# Patient Record
Sex: Female | Born: 2000 | Race: White | Hispanic: No | Marital: Single | Smoking: Never smoker
Health system: Southern US, Community
[De-identification: ages and names within clinical notes are randomized; demographics above are authoritative.]

## PROBLEM LIST (undated history)

## (undated) DIAGNOSIS — Q909 Down syndrome, unspecified: Secondary | ICD-10-CM

## (undated) DIAGNOSIS — L709 Acne, unspecified: Secondary | ICD-10-CM

## (undated) DIAGNOSIS — L732 Hidradenitis suppurativa: Secondary | ICD-10-CM

## (undated) DIAGNOSIS — H905 Unspecified sensorineural hearing loss: Secondary | ICD-10-CM

## (undated) DIAGNOSIS — K219 Gastro-esophageal reflux disease without esophagitis: Secondary | ICD-10-CM

## (undated) DIAGNOSIS — T7840XA Allergy, unspecified, initial encounter: Secondary | ICD-10-CM

## (undated) HISTORY — PX: ADENOIDECTOMY: SUR15

## (undated) HISTORY — DX: Allergy, unspecified, initial encounter: T78.40XA

## (undated) HISTORY — PX: TONSILLECTOMY: SUR1361

## (undated) HISTORY — DX: Down syndrome, unspecified: Q90.9

## (undated) HISTORY — DX: Hidradenitis suppurativa: L73.2

## (undated) HISTORY — PX: TYMPANOSTOMY TUBE PLACEMENT: SHX32

## (undated) HISTORY — DX: Acne, unspecified: L70.9

---
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
## 2018-01-15 ENCOUNTER — Telehealth: Payer: Self-pay | Admitting: Pediatrics

## 2018-01-15 NOTE — Telephone Encounter (Signed)
Mom wants tro talk to you about Mallory Hancock and her ankle please

## 2018-01-15 NOTE — Telephone Encounter (Signed)
3 years ago Mallory Hancock took dance. She hit her foot on a chair and broke a bone in the left foot. She wants to start clogging and looked up tutorials online. She watched tutorials, practiced and showed her mom what she had learned. The next day, Mallory Hancock c/o pain in the foot. She has a knot on the back of the ankle. She has been icing the ankle, elevating it, taking Motrin. She only complains of pain with weight bearing. The pain has improved but continues to bother her with weight bearing. Instructed mom to continue with cold pack, elevation, and Motrin. If no improvement by Wednesday morning, mom is to call for an appointment. Mom verbalized understanding and agreement.

## 2018-01-17 ENCOUNTER — Encounter: Payer: Self-pay | Admitting: Pediatrics

## 2018-01-17 ENCOUNTER — Ambulatory Visit (INDEPENDENT_AMBULATORY_CARE_PROVIDER_SITE_OTHER): Payer: Medicaid Other | Admitting: Pediatrics

## 2018-01-17 ENCOUNTER — Other Ambulatory Visit: Payer: Self-pay | Admitting: Pediatrics

## 2018-01-17 ENCOUNTER — Telehealth: Payer: Self-pay | Admitting: Pediatrics

## 2018-01-17 ENCOUNTER — Ambulatory Visit
Admission: RE | Admit: 2018-01-17 | Discharge: 2018-01-17 | Disposition: A | Discharge status: Home / Self Care | Payer: Medicaid Other | Source: Ambulatory Visit | Attending: Pediatrics | Admitting: Pediatrics

## 2018-01-17 VITALS — Wt 150.8 lb

## 2018-01-17 DIAGNOSIS — S99911A Unspecified injury of right ankle, initial encounter: Secondary | ICD-10-CM | POA: Insufficient documentation

## 2018-01-17 DIAGNOSIS — M25572 Pain in left ankle and joints of left foot: Secondary | ICD-10-CM

## 2018-01-17 NOTE — Progress Notes (Signed)
Subjective:    Mallory HesselbachSamantha Hancock is a 17 y.o. female who presents with right ankle pain. Onset of the symptoms was 6 days ago. Inciting event: injured while dancing. Current symptoms include: ability to bear weight, but with some pain and swelling. Aggravating factors: direct pressure, going up and down stairs, walking  and weight bearing. Symptoms have stabilized. Patient has had prior ankle problems. Evaluation to date: none. Treatment to date: avoidance of offending activity and OTC analgesics which are somewhat effective. The following portions of the patient's history were reviewed and updated as appropriate: allergies, current medications, past family history, past medical history, past social history, past surgical history and problem list.    Objective:    Wt 150 lb 12.8 oz (68.4 kg)   BMI 29.45 kg/m  Right ankle:   mild+ effusion noted laterally  Left ankle:   normal   Imaging: X-ray of the right ankle(s): ordered, but results not yet available    Assessment:    Injury of right ankle    Plan:    Natural history and expected course discussed. Questions answered. Rest, ice, compression, elevation (RICE) therapy. Transport plannerducational materials distributed. OTC analgesics as needed. Xray ordered, will call parent with results.   Follow up as needed

## 2018-01-17 NOTE — Patient Instructions (Signed)
[facility] [address]- will call with results   RICE for Routine Care of Injuries Many injuries can be cared for using rest, ice, compression, and elevation (RICE therapy). Using RICE therapy can help to lessen pain and swelling. It can help your body to heal. Rest Reduce your normal activities and avoid using the injured part of your body. You can go back to your normal activities when you feel okay and your doctor says it is okay. Ice Do not put ice on your bare skin.  Put ice in a plastic bag.  Place a towel between your skin and the bag.  Leave the ice on for 20 minutes, 2-3 times a day.  Do this for as long as told by your doctor. Compression Compression means putting pressure on the injured area. This can be done with an elastic bandage. If an elastic bandage has been applied:  Remove and reapply the bandage every 3-4 hours or as told by your doctor.  Make sure the bandage is not wrapped too tight. Wrap the bandage more loosely if part of your body beyond the bandage is blue, swollen, cold, painful, or loses feeling (numb).  See your doctor if the bandage seems to make your problems worse.  Elevation Elevation means keeping the injured area raised. Raise the injured area above your heart or the center of your chest if you can. When should I get help? You should get help if:  You keep having pain and swelling.  Your symptoms get worse.  Get help right away if: You should get help right away if:  You have sudden bad pain at or below the area of your injury.  You have redness or more swelling around your injury.  You have tingling or numbness at or below the injury that does not go away when you take off the bandage.  This information is not intended to replace advice given to you by your health care provider. Make sure you discuss any questions you have with your health care provider. Document Released: 01/11/2008 Document Revised: 06/21/2016  Document Reviewed: 07/02/2014 Elsevier Interactive Patient Education  2017 [employer]

## 2018-01-17 NOTE — Telephone Encounter (Signed)
Discussed ankle xray results with mom.

## 2018-01-22 ENCOUNTER — Telehealth: Payer: Self-pay | Admitting: Pediatrics

## 2018-01-22 MED ORDER — RANITIDINE HCL 15 MG/ML PO SYRP: ORAL_SOLUTION | ORAL | 2 refills | Status: DC

## 2018-01-22 NOTE — Telephone Encounter (Signed)
Developed acid reflux over the weekend, complained of chest pain after eating pizza and chips. She is taking mylanta, tums with minimal improvement. Will send in liquid Zantac. Mom verbalized understanding and agreement.

## 2018-01-22 NOTE — Telephone Encounter (Signed)
Mallory Hancock is really bothered by acid reflux and mom wants to know if there is anything in a liquid form that she could take and would like to talk to you please

## 2018-02-22 ENCOUNTER — Telehealth: Payer: Self-pay | Admitting: Pediatrics

## 2018-02-22 DIAGNOSIS — Q909 Down syndrome, unspecified: Secondary | ICD-10-CM

## 2018-02-22 NOTE — Telephone Encounter (Signed)
Mother would like to talk to you about child seeing a nutritionist

## 2018-02-22 NOTE — Telephone Encounter (Signed)
Mallory Hancock has both Medicaid and [employer] insurance. Mom would like to take Mallory Hancock to a nutritionist for tips to help keep Mallory Hancock's weight under control. Will refer to nutrition for further evaluation.

## 2018-04-06 ENCOUNTER — Other Ambulatory Visit: Payer: Self-pay | Admitting: Otolaryngology

## 2018-04-06 DIAGNOSIS — Q909 Down syndrome, unspecified: Secondary | ICD-10-CM

## 2018-04-06 DIAGNOSIS — H7201 Central perforation of tympanic membrane, right ear: Secondary | ICD-10-CM

## 2018-04-18 ENCOUNTER — Ambulatory Visit
Admission: RE | Admit: 2018-04-18 | Discharge: 2018-04-18 | Disposition: A | Discharge status: Home / Self Care | Payer: Medicaid Other | Source: Ambulatory Visit | Attending: Otolaryngology | Admitting: Otolaryngology

## 2018-04-18 DIAGNOSIS — H7201 Central perforation of tympanic membrane, right ear: Secondary | ICD-10-CM

## 2018-04-18 DIAGNOSIS — Q909 Down syndrome, unspecified: Secondary | ICD-10-CM

## 2018-04-18 MED ORDER — IOPAMIDOL (ISOVUE-300) INJECTION 61%
75.0000 mL | Freq: Once | INTRAVENOUS | Status: AC | PRN
  Administered 2018-04-18: 75 mL via INTRAVENOUS

## 2018-04-20 ENCOUNTER — Telehealth: Payer: Self-pay | Admitting: Pediatrics

## 2018-04-20 NOTE — Telephone Encounter (Signed)
Left message, encouraged call back 

## 2018-04-20 NOTE — Telephone Encounter (Signed)
Mother would like to talk to you about child's appointment with Dr Dorma RussellKraus . She will not be available until 2pm

## 2018-04-30 DIAGNOSIS — Z0279 Encounter for issue of other medical certificate: Secondary | ICD-10-CM

## 2018-05-03 ENCOUNTER — Ambulatory Visit: Payer: Medicaid Other | Admitting: Nutrition

## 2018-05-08 ENCOUNTER — Encounter: Payer: Medicaid Other | Attending: Pediatrics | Admitting: Nutrition

## 2018-05-08 ENCOUNTER — Encounter: Payer: Self-pay | Admitting: Nutrition

## 2018-05-08 ENCOUNTER — Ambulatory Visit: Payer: Medicaid Other | Admitting: Nutrition

## 2018-05-08 VITALS — Ht 62.0 in | Wt 145.0 lb

## 2018-05-08 DIAGNOSIS — Q909 Down syndrome, unspecified: Secondary | ICD-10-CM | POA: Diagnosis present

## 2018-05-08 DIAGNOSIS — Z713 Dietary counseling and surveillance: Secondary | ICD-10-CM | POA: Diagnosis not present

## 2018-05-08 DIAGNOSIS — Z6826 Body mass index (BMI) 26.0-26.9, adult: Secondary | ICD-10-CM

## 2018-05-08 NOTE — Progress Notes (Signed)
  Medical Nutrition Therapy:  Appt start time: 1500 end time:  1600  Assessment:  Primary concerns today: Wants to lose weight.  Here with her mom. Her mom is wanting her  to lose weight. Her mother wants her to lose about 10-15 lbs. Thinks she should weigh about 130 lbs. 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 Out going and very high functioning with Downs Syndrome. Eats 3 meals per day. Snacks evening; yogurt or fruit. Food recall reveals food choices are very healthy and balanced.  Recorded wt and ht on Downs Syndrome Charts and she is between 10-20 % ht for age and about 50% for her weight for age on the Down Syndrome Charts. She isn't overweight per BMI of 26.  No chewing or swallowing issues but doesn't like a lot of textures. No bowel issues.  She is engaged to work on increased physical cardio activity like swimming to help with a few pounds of weight loss   Vitals with BMI 05/08/2018  Height 5\' 2"   Weight 145 lbs  BMI 26.51  Systolic   Diastolic     Preferred Learning Style:   No preference indicated   Learning Readiness:   Ready  Change in progress   MEDICATIONS:    DIETARY INTAKE:  24-hr recall:  B ( AM): Oatmeal, applesauce,  Water and 4 oz milk Snk ( AM):   L ( PM):  Chick nugggets with chik fila sauce, applesauce, milk, water Or  chese cube and peaches,  D ( PM):  Brunswick stew 1 cup,  Applesauce, crackers 3-4,water Snk ( PM):  1/2 c chocolate milk- 1 time    Per month. Beverages: water  Usual physical activity: dance and cheer team.  Estimated energy needs: 1200  calories 135 g carbohydrates 90 g protein 33 g fat  Progress Towards Goal(s):  In progress.   Nutritional Diagnosis:  NB-1.1 Food and nutrition-related knowledge deficit As related to being overly concerned about weight and diet.  As evidenced by BMI is 26 and is WNL with Downs Syndrome..    Intervention:  Nutrition and Diabetes education provided on  My Plate, CHO counting, meal planning, portion sizes, timing of meals, avoiding snacks between meals unless having a low blood sugar, target ranges for A1C and blood sugars, signs/symptoms and treatment of hyper/hypoglycemia, monitoring blood sugars, taking medications as prescribed, benefits of exercising 30 minutes per day and prevention of complications of DM. Marland KitchenGoals 1. Follow MY Plate eating three meals per day and don't skip meals.  2. Eat 4-5 servings of fruits and vegetables per day. Consider trying to add swimming into routine for greater cardio workout. Safe weight would be about 140-142 lbs if weight loss is desired.  Teaching Method Utilized:  Visual Auditory Hands on  Handouts given during visit include:  The Plate Method   Meal Plan Card   Barriers to learning/adherence to lifestyle change: none  Demonstrated degree of understanding via:  Teach Back   Monitoring/Evaluation:  Dietary intake, exercise, meal planning , and body weight prn.

## 2018-05-08 NOTE — Patient Instructions (Signed)
Goals 1. Follow MY Plate eating three meals per day and don't skip meals.  2. Eat 4-5 servings of fruits and vegetables per day. Consider trying to add swimming into routine for greater cardio workout. Safe weight would be about 140-142 lbs if weight loss is desired.

## 2018-06-06 ENCOUNTER — Ambulatory Visit (INDEPENDENT_AMBULATORY_CARE_PROVIDER_SITE_OTHER): Payer: Medicaid Other | Admitting: Pediatrics

## 2018-06-06 ENCOUNTER — Encounter: Payer: Self-pay | Admitting: Pediatrics

## 2018-06-06 VITALS — BP 114/70 | Ht 60.0 in | Wt 142.9 lb

## 2018-06-06 DIAGNOSIS — Z68.41 Body mass index (BMI) pediatric, 85th percentile to less than 95th percentile for age: Secondary | ICD-10-CM | POA: Diagnosis not present

## 2018-06-06 DIAGNOSIS — Z23 Encounter for immunization: Secondary | ICD-10-CM

## 2018-06-06 DIAGNOSIS — Z0001 Encounter for general adult medical examination with abnormal findings: Secondary | ICD-10-CM | POA: Diagnosis not present

## 2018-06-06 DIAGNOSIS — Q909 Down syndrome, unspecified: Secondary | ICD-10-CM

## 2018-06-06 DIAGNOSIS — Z0000 Encounter for general adult medical examination without abnormal findings: Secondary | ICD-10-CM

## 2018-06-06 LAB — TSH: TSH: 2.91 m[IU]/L

## 2018-06-06 LAB — CBC WITH DIFFERENTIAL/PLATELET
BASOS ABS: 69 {cells}/uL (ref 0–200)
Basophils Relative: 1.4 %
Eosinophils Absolute: 29 cells/uL (ref 15–500)
Eosinophils Relative: 0.6 %
HCT: 41.8 % (ref 35.0–45.0)
Hemoglobin: 13.9 g/dL (ref 11.7–15.5)
Lymphs Abs: 1269 cells/uL (ref 850–3900)
MCH: 30.3 pg (ref 27.0–33.0)
MCHC: 33.3 g/dL (ref 32.0–36.0)
MCV: 91.1 fL (ref 80.0–100.0)
MPV: 10.6 fL (ref 7.5–12.5)
Monocytes Relative: 6.8 %
NEUTROS ABS: 3200 {cells}/uL (ref 1500–7800)
Neutrophils Relative %: 65.3 %
PLATELETS: 276 10*3/uL (ref 140–400)
RBC: 4.59 10*6/uL (ref 3.80–5.10)
RDW: 13.5 % (ref 11.0–15.0)
Total Lymphocyte: 25.9 %
WBC mixed population: 333 cells/uL (ref 200–950)
WBC: 4.9 10*3/uL (ref 3.8–10.8)

## 2018-06-06 LAB — T4, FREE: Free T4: 1.1 ng/dL (ref 0.8–1.4)

## 2018-06-06 LAB — LIPID PANEL
Cholesterol: 111 mg/dL (ref <170)
HDL: 49 mg/dL (ref >45)
LDL Cholesterol (Calc): 39 mg/dL (calc) (ref <110)
Non-HDL Cholesterol (Calc): 62 mg/dL (calc) (ref <120)
Total CHOL/HDL Ratio: 2.3 (calc) (ref <5.0)
Triglycerides: 151 mg/dL — ABNORMAL HIGH (ref <90)

## 2018-06-06 NOTE — Progress Notes (Signed)
Subjective:     Mallory Hancock is a 17 y.o. female and is here for a comprehensive physical exam. The patient reports no problems.  Social History   Socioeconomic History  . Marital status: Single    Spouse name: Not on file  . Number of children: Not on file  . Years of education: Not on file  . Highest education level: Not on file  Occupational History  . Not on file  Social Needs  . Financial resource strain: Not on file  . Food insecurity:    Worry: Not on file    Inability: Not on file  . Transportation needs:    Medical: Not on file    Non-medical: Not on file  Tobacco Use  . Smoking status: Never Smoker  . Smokeless tobacco: Never Used  Substance and Sexual Activity  . Alcohol use: No  . Drug use: No  . Sexual activity: Never    Birth control/protection: Abstinence  Lifestyle  . Physical activity:    Days per week: Not on file    Minutes per session: Not on file  . Stress: Not on file  Relationships  . Social connections:    Talks on phone: Not on file    Gets together: Not on file    Attends religious service: Not on file    Active member of club or organization: Not on file    Attends meetings of clubs or organizations: Not on file    Relationship status: Not on file  . Intimate partner violence:    Fear of current or ex partner: Not on file    Emotionally abused: Not on file    Physically abused: Not on file    Forced sexual activity: Not on file  Other Topics Concern  . Not on file  Social History Narrative   Lives with mom and dad, one older half siblings   Home schooled   Supportive home environment, independent self care   Social opportunities thru family, school groups, community   MGF passed away in Oct 14, 2013- family aware of [facility] program thru [facility]osp[facility] if needed.   Health Maintenance  Topic Date Due  . HIV Screening  05/01/2014  . INFLUENZA VACCINE  03/08/2018  . TETANUS/TDAP  03/30/2021    The following portions of the patient's  history were reviewed and updated as appropriate: allergies, current medications, past family history, past medical history, past social history, past surgical history and problem list.  Review of Systems Pertinent items are noted in HPI.   Objective:    BP 114/70   Ht 5' (1.524 m)   Wt 142 lb 14.4 oz (64.8 kg)   BMI 27.91 kg/m  General appearance: alert, cooperative, appears stated age and no distress Head: Normocephalic, without obvious abnormality, atraumatic Eyes: conjunctivae/corneas clear. PERRL, EOM's intact. Fundi benign. Ears: normal TM's and external ear canals both ears Nose: Nares normal. Septum midline. Mucosa normal. No drainage or sinus tenderness. Throat: lips, mucosa, and tongue normal; teeth and gums normal Neck: no adenopathy, no carotid bruit, no JVD, supple, symmetrical, trachea midline and thyroid not enlarged, symmetric, no tenderness/mass/nodules Lungs: clear to auscultation bilaterally Heart: regular rate and rhythm, S1, S2 normal, no murmur, click, rub or gallop Abdomen: soft, non-tender; bowel sounds normal; no masses,  no organomegaly Extremities: extremities normal, atraumatic, no cyanosis or edema Skin: Skin color, texture, turgor normal. No rashes or lesions Neurologic: Grossly normal    Assessment:    Healthy female exam  Plan:  Annual labs per orders See After Visit Summary for Counseling Recommendations   Flu and MenB vaccines per orders. Flu vaccine per orders. Indications, contraindications and side effects of vaccine/vaccines discussed with parent and parent verbally expressed understanding and also agreed with the administration of vaccine/vaccines as ordered above today.Handout (VIS) given for each vaccine at this visit.

## 2018-06-06 NOTE — Patient Instructions (Signed)
Preventive Care for [place], Female The transition to life after high school as a young adult can be a stressful time with many changes. You may start seeing a primary care physician instead of a pediatrician. This is the time when your health care becomes your responsibility. Preventive care refers to lifestyle choices and visits with your health care provider that can promote health and wellness. What does preventive care include?  A yearly physical exam. This is also called an annual wellness visit.  Dental exams once or twice a year.  Routine eye exams. Ask your health care provider how often you should have your eyes checked.  Personal lifestyle choices, including: ? Daily care of your teeth and gums. ? Regular physical activity. ? Eating a healthy diet. ? Avoiding tobacco and drug use. ? Avoiding or limiting alcohol use. ? Practicing safe sex. ? Taking vitamin and mineral supplements as recommended by your health care provider. What happens during an annual wellness visit? Preventive care starts with a yearly visit to your primary care physician. The services and screenings done by your health care provider during your annual wellness visit will depend on your overall health, lifestyle risk factors, and family history of disease. Counseling Your health care provider may ask you questions about:  Past medical problems and your family's medical history.  Medicines or supplements you take.  Health insurance and access to health care.  Alcohol, tobacco, and drug use.  Your safety at home, work, or school.  Access to firearms.  Emotional well-being and how you cope with stress.  Relationship well-being.  Diet, exercise, and sleep habits.  Your sexual health and activity.  Your methods of birth control.  Your menstrual cycle.  Your pregnancy history.  Screening You may have the following tests or measurements:  Height, weight, and BMI.  Blood  pressure.  Lipid and cholesterol levels.  Tuberculosis skin test.  Skin exam.  Vision and hearing tests.  Screening test for hepatitis.  Screening tests for sexually transmitted diseases (STDs), if you are at risk.  BRCA-related cancer screening. This may be done if you have a family history of breast, ovarian, tubal, or peritoneal cancers.  Pelvic exam and Pap test. This may be done every 3 years starting at age 35.  Vaccines Your health care provider may recommend certain vaccines, such as:  Influenza vaccine. This is recommended every year.  Tetanus, diphtheria, and acellular pertussis (Tdap, Td) vaccine. You may need a Td booster every 10 years.  Varicella vaccine. You may need this if you have not been vaccinated.  HPV vaccine. If you are 25 or younger, you may need three doses over 6 months.  Measles, mumps, and rubella (MMR) vaccine. You may need at least one dose of MMR. You may also need a second dose.  Pneumococcal 13-valent conjugate (PCV13) vaccine. You may need this if you have certain conditions and were not previously vaccinated.  Pneumococcal polysaccharide (PPSV23) vaccine. You may need one or two doses if you smoke cigarettes or if you have certain conditions.  Meningococcal vaccine. One dose is recommended if you are age 24-21 years and a first-year college student living in a residence hall, or if you have one of several medical conditions. You may also need additional booster doses.  Hepatitis A vaccine. You may need this if you have certain conditions or if you travel or work in places where you may be exposed to hepatitis A.  Hepatitis B vaccine. You may need this if  you have certain conditions or if you travel or work in places where you may be exposed to hepatitis B.  Haemophilus influenzae type b (Hib) vaccine. You may need this if you have certain risk factors.  Talk to your health care provider about which screenings and vaccines you need and how  often you need them. What steps can I take to develop healthy behaviors?  Have regular preventive health care visits with your primary care physician and dentist.  Eat a healthy diet.  Drink enough fluid to keep your urine clear or pale yellow.  Stay active. Exercise at least 30 minutes 5 or more days of the week.  Use alcohol responsibly.  Maintain a healthy weight.  Do not use any products that contain nicotine, such as cigarettes, chewing tobacco, and e-cigarettes. If you need help quitting, ask your health care provider.  Do not use drugs.  Practice safe sex.  Use birth control (contraception) to prevent unwanted pregnancy. If you plan to become pregnant, see your health care provider for a pre-conception visit.  Find healthy ways to manage stress. How can I protect myself from injury? Injuries from violence or accidents are the leading cause of death among young adults and can often be prevented. Take these steps to help protect yourself:  Always wear your seat belt while driving or riding in a vehicle.  Do not drive if you have been drinking alcohol. Do not ride with someone who has been drinking.  Do not drive when you are tired or distracted. Do not text while driving.  Wear a helmet and other protective equipment during sports activities.  If you have firearms in your house, make sure you follow all gun safety procedures.  Seek help if you have been bullied, physically abused, or sexually abused.  Use the Internet responsibly to avoid dangers such as online bullying and online sexual predators.  What can I do to cope with stress? Young adults may face many new challenges that can be stressful, such as finding a job, going to college, moving away from home, managing money, being in a relationship, getting married, and having children. To manage stress:  Avoid known stressful situations when you can.  Exercise regularly.  Find a stress-reducing activity that  works best for you. Examples include meditation, yoga, listening to music, or reading.  Spend time in nature.  Keep a journal to write about your stress and how you respond.  Talk to your health care provider about stress. He or she may suggest counseling.  Spend time with supportive friends or family.  Do not cope with stress by: ? Drinking alcohol or using drugs. ? Smoking cigarettes. ? Eating.  Where can I get more information? Learn more about preventive care and healthy habits from:  [place] and Gynecologists: KaraokeExchange.nl  U.S. Probation officer Task Force: StageSync.si  National Adolescent and [place]: StrategicRoad.nl  American Academy of Pediatrics Bright Futures: https://brightfutures.MemberVerification.co.za  Society for Adolescent Health and Medicine: MoralBlog.co.za.aspx  PodExchange.nl: ToyLending.fr  This information is not intended to replace advice given to you by your health care provider. Make sure you discuss any questions you have with your health care provider. Document Released: 12/10/2015 Document Revised: 12/31/2015 Document Reviewed: 12/10/2015 Elsevier Interactive Patient Education  [employer].

## 2018-06-08 ENCOUNTER — Other Ambulatory Visit: Payer: Self-pay | Admitting: Pediatrics

## 2018-06-08 ENCOUNTER — Telehealth: Payer: Self-pay | Admitting: Pediatrics

## 2018-06-08 MED ORDER — PROBIOTIC-10 PO CAPS: 1.0000 | ORAL_CAPSULE | Freq: Every day | ORAL | 6 refills | Status: DC

## 2018-06-08 NOTE — Telephone Encounter (Signed)
Left message, encouraged call back,    Annual labs were WNL, except triglycerides which were elevated. Lipid panel was not fasting.

## 2018-06-08 NOTE — Telephone Encounter (Signed)
Spoke with mom re: Vana's lab results WNL. Mom verbalized understanding.

## 2018-06-12 ENCOUNTER — Ambulatory Visit (INDEPENDENT_AMBULATORY_CARE_PROVIDER_SITE_OTHER): Payer: Medicaid Other | Admitting: Pediatrics

## 2018-06-12 ENCOUNTER — Encounter: Payer: Self-pay | Admitting: Pediatrics

## 2018-06-12 VITALS — Temp 98.7°F | Wt 144.5 lb

## 2018-06-12 DIAGNOSIS — J029 Acute pharyngitis, unspecified: Secondary | ICD-10-CM

## 2018-06-12 LAB — POCT RAPID STREP A (OFFICE): Rapid Strep A Screen: NEGATIVE

## 2018-06-12 NOTE — Patient Instructions (Addendum)
Warm salt water gargles or hot tea with honey/hot water with honey and lemon Encourage plenty of fluids Nasal decongestant as needed Rapid strep negative, throat culture sent to lab- no news is good news

## 2018-06-12 NOTE — Progress Notes (Signed)
Subjective:     Mallory Hancock is a 17 y.o. female who presents for evaluation of sore throat. Associated symptoms include nasal blockage, post nasal drip, productive cough, sinus and nasal congestion, sore throat and low grade fevers. Onset of symptoms was 2 days ago, and have been gradually worsening since that time. She is drinking plenty of fluids. She has not had a recent close exposure to someone with proven streptococcal pharyngitis.  The following portions of the patient's history were reviewed and updated as appropriate: allergies, current medications, past family history, past medical history, past social history, past surgical history and problem list.  Review of Systems Pertinent items are noted in HPI.    Objective:    Temp 98.7 F (37.1 C) (Temporal)   Wt 144 lb 8 oz (65.5 kg)   BMI 28.22 kg/m  General appearance: alert, cooperative, appears stated age and no distress Head: Normocephalic, without obvious abnormality, atraumatic Eyes: conjunctivae/corneas clear. PERRL, EOM's intact. Fundi benign. Ears: normal TM's and external ear canals both ears Nose: Nares normal. Septum midline. Mucosa normal. No drainage or sinus tenderness., moderate congestion Throat: lips, mucosa, and tongue normal; teeth and gums normal, pharynx erythematous without exudate Neck: no adenopathy, no carotid bruit, no JVD, supple, symmetrical, trachea midline and thyroid not enlarged, symmetric, no tenderness/mass/nodules Lungs: clear to auscultation bilaterally Heart: regular rate and rhythm, S1, S2 normal, no murmur, click, rub or gallop  Laboratory Strep test done. Results:negative.    Assessment:    Acute pharyngitis, likely  Viral pharyngitis.    Plan:    Use of OTC analgesics recommended as well as salt water gargles. Use of decongestant recommended. Follow up as needed. Throat culture pending, will call parent/patient if culture results positive. Parent/patient aware.

## 2018-06-15 LAB — CULTURE, GROUP A STREP
MICRO NUMBER: 91336423
SPECIMEN QUALITY:: ADEQUATE

## 2018-09-26 ENCOUNTER — Ambulatory Visit: Payer: Medicaid Other

## 2018-10-19 ENCOUNTER — Telehealth: Payer: Self-pay | Admitting: Pediatrics

## 2018-10-19 NOTE — Telephone Encounter (Signed)
Note written for school.

## 2018-10-19 NOTE — Telephone Encounter (Signed)
Mom needs a letter saying that Mallory Hancock's parents pulled her out of school because of the [place] 17 and her respiratory infection and that we are aware of it. If you have any questions mom said to call her please

## 2019-03-19 ENCOUNTER — Other Ambulatory Visit: Payer: Self-pay

## 2019-03-19 ENCOUNTER — Encounter: Payer: Self-pay | Admitting: Pediatrics

## 2019-03-19 ENCOUNTER — Ambulatory Visit (INDEPENDENT_AMBULATORY_CARE_PROVIDER_SITE_OTHER): Payer: Medicaid Other | Admitting: Pediatrics

## 2019-03-19 VITALS — Wt 148.4 lb

## 2019-03-19 DIAGNOSIS — R590 Localized enlarged lymph nodes: Secondary | ICD-10-CM

## 2019-03-19 NOTE — Progress Notes (Signed)
Subjective:     Mallory Hancock is a 18 y.o. female who presents for evaluation of lump below the left ear. Symptoms include none. Onset of symptoms was 2 days ago, and has been stable since that time. Treatment to date: none.  The following portions of the patient's history were reviewed and updated as appropriate: allergies, current medications, past family history, past medical history, past social history, past surgical history and problem list.  Review of Systems Pertinent items are noted in HPI.   Objective:    General appearance: alert, cooperative, appears stated age and no distress Head: Normocephalic, without obvious abnormality, atraumatic Eyes: conjunctivae/corneas clear. PERRL, EOM's intact. Fundi benign. Ears: normal TM's and external ear canals both ears Nose: Nares normal. Septum midline. Mucosa normal. No drainage or sinus tenderness. Throat: lips, mucosa, and tongue normal; teeth and gums normal Neck: no adenopathy, no carotid bruit, no JVD, supple, symmetrical, trachea midline and thyroid not enlarged, symmetric, no tenderness/mass/nodules Lungs: clear to auscultation bilaterally Heart: regular rate and rhythm, S1, S2 normal, no murmur, click, rub or gallop   Assessment:    Anterior cervical lymphadenopathy   Plan:    No palpable lymph node at time of exam Follow up as needed

## 2019-07-12 IMAGING — CT CT TEMPORAL BONES W/ CM
2 series · 15 of 30 positions shown, 19 images · IV contrast (iopamidol)
Comparison: None

CLINICAL DATA: Down syndrome. Left ear perforation. Evaluate
mastoiditis. History tonsillectomy and myringotomy tube.

EXAM:
CT TEMPORAL BONES WITH CONTRAST
TECHNIQUE: Axial and coronal plane CT imaging of the petrous temporal bones was
performed with thin-collimation image reconstruction after
intravenous contrast administration. Multiplanar CT image
reconstructions were also generated.
CONTRAST:  75mL 0CAATN-UHH IOPAMIDOL (0CAATN-UHH) INJECTION 61%

[Series 4: soft temporal · axial · 0.34mm/px · z∈[-136,-72]mm · 13 of 38 slices shown, 17 images]
[im 3/38  brain]
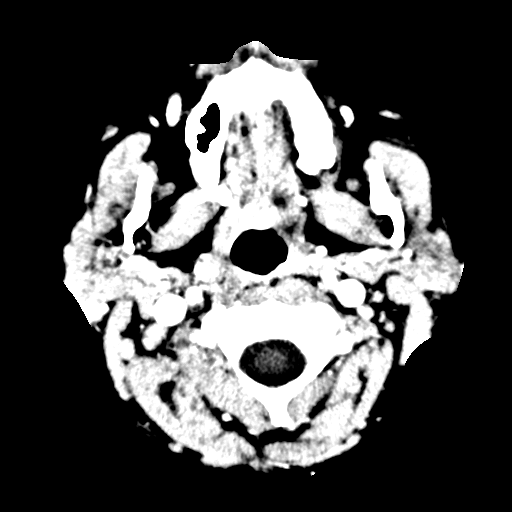
[im 3/38  bone]
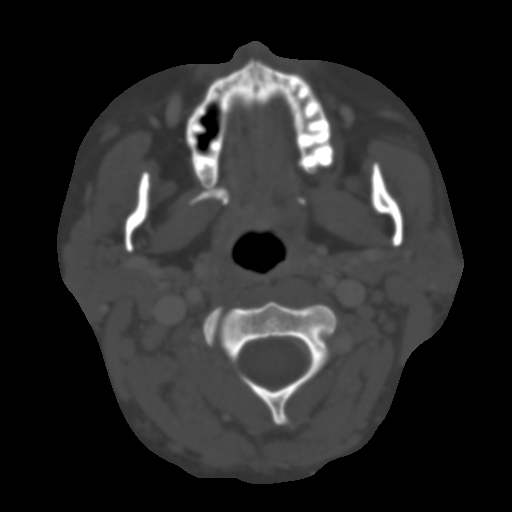
[im 6/38  bone]
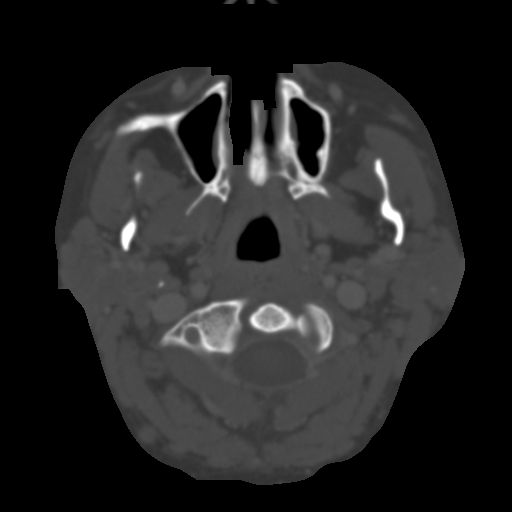
[im 8/38  bone]
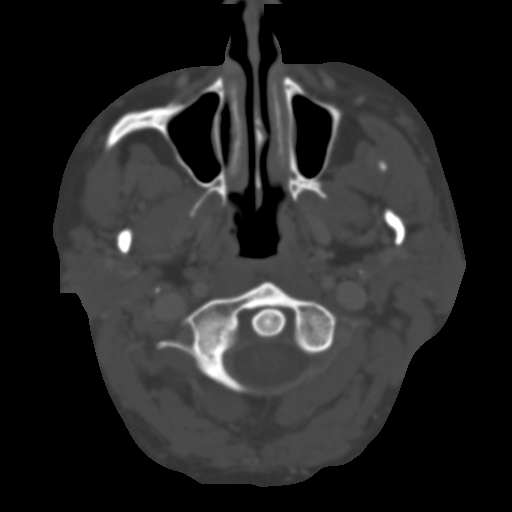
[im 11/38  bone]
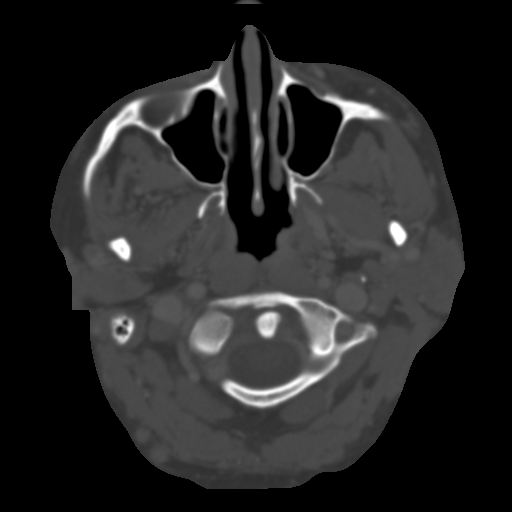
[im 14/38  brain]
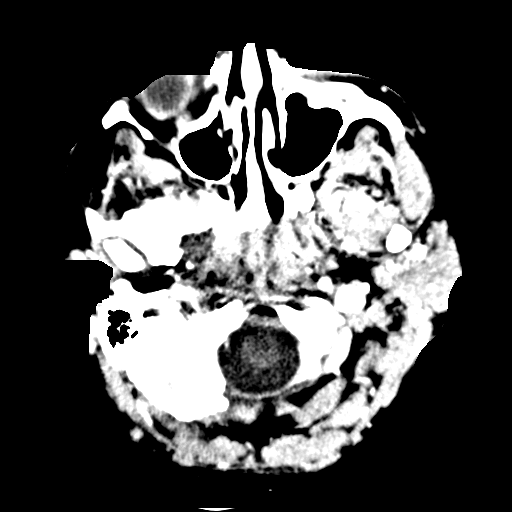
[im 14/38  bone]
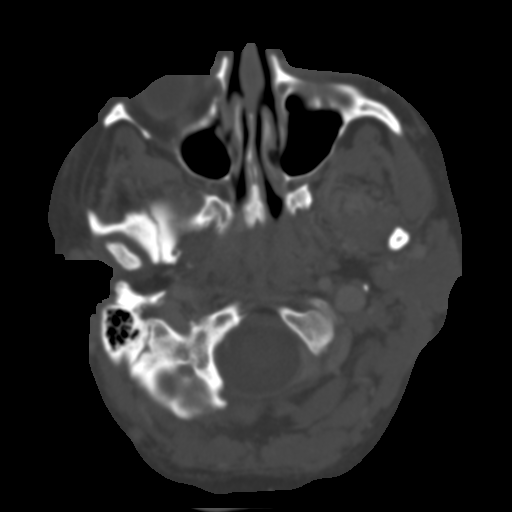
[im 16/38  bone]
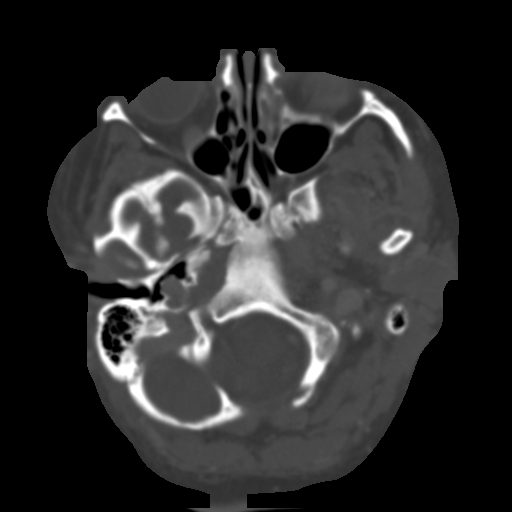
[im 19/38  bone]
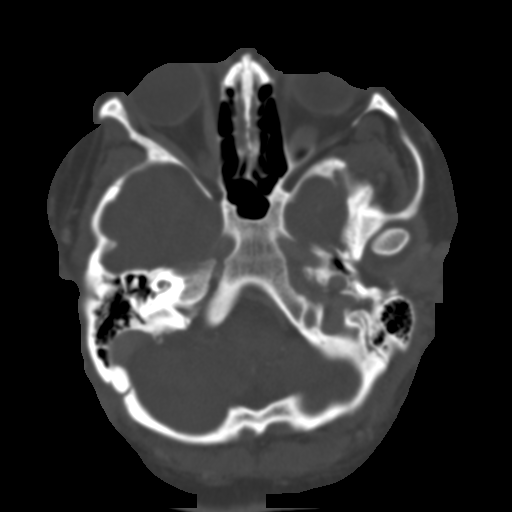
[im 22/38  bone]
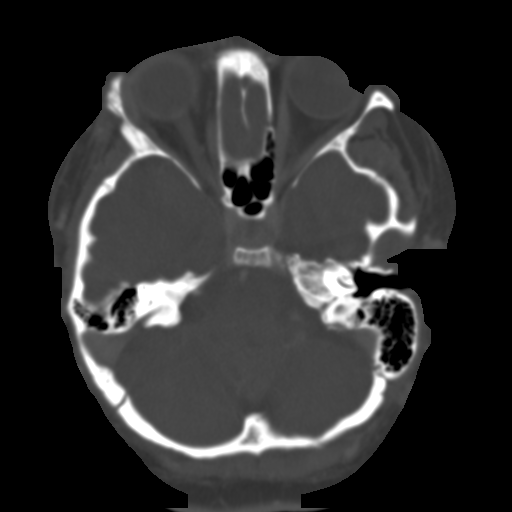
[im 24/38  brain]
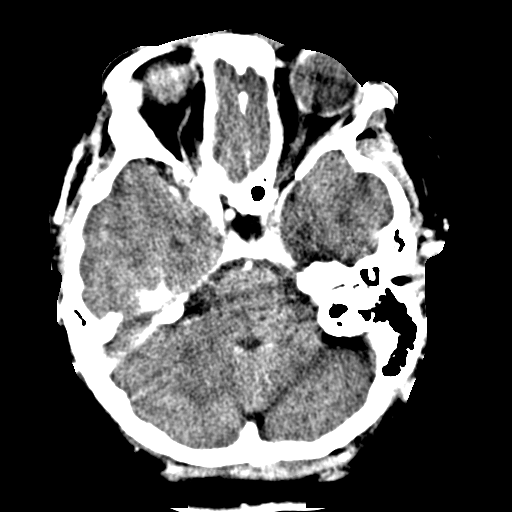
[im 24/38  bone]
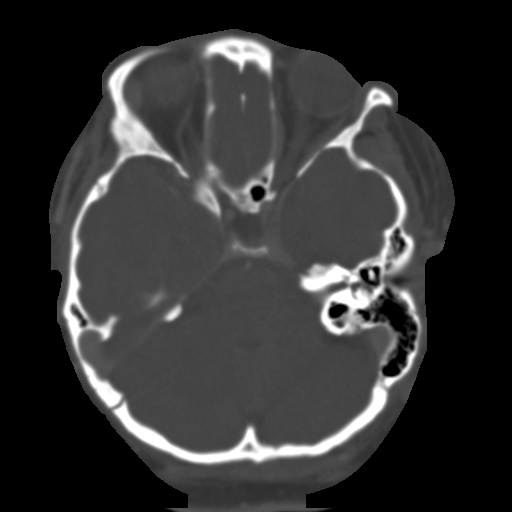
[im 27/38  bone]
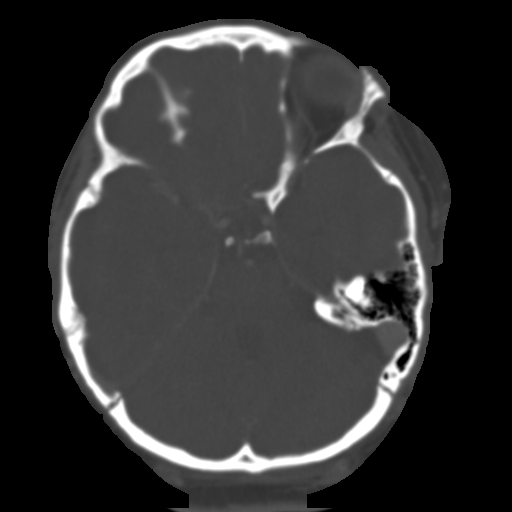
[im 30/38  bone]
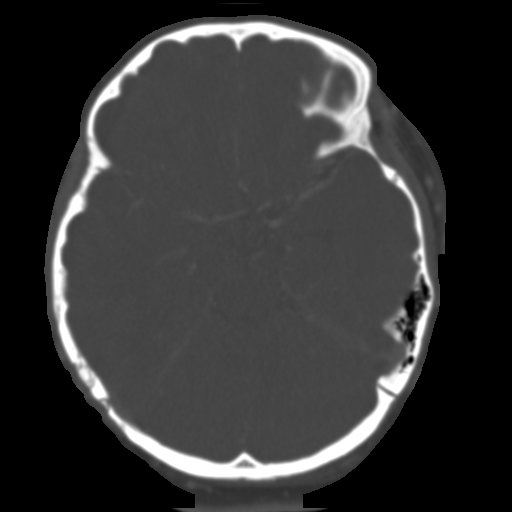
[im 32/38  bone]
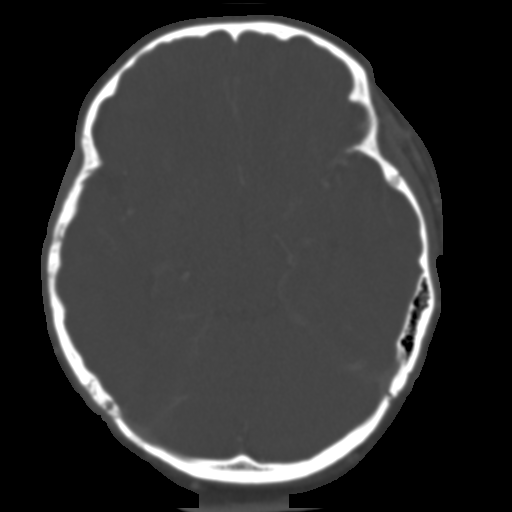
[im 35/38  brain]
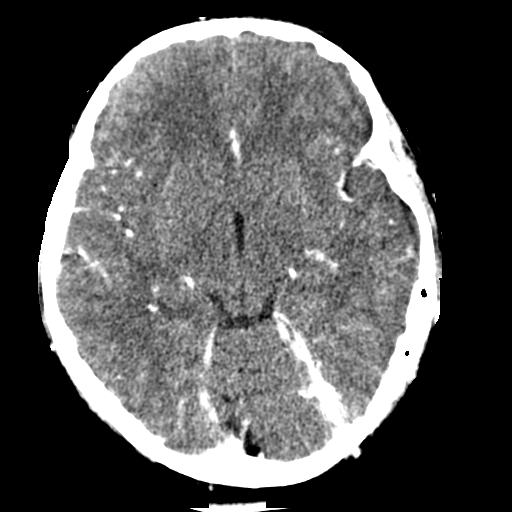
[im 35/38  bone]
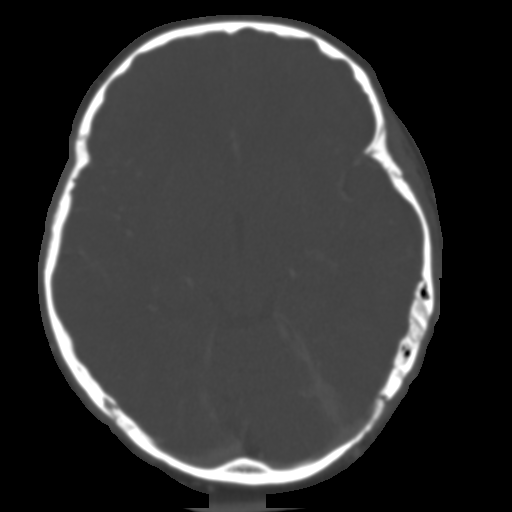

[Series 5: cor temporal soft · axial · 0.34mm/px · z∈[-136,-126]mm · 2 of 38 slices shown]
[im 3/38  brain]
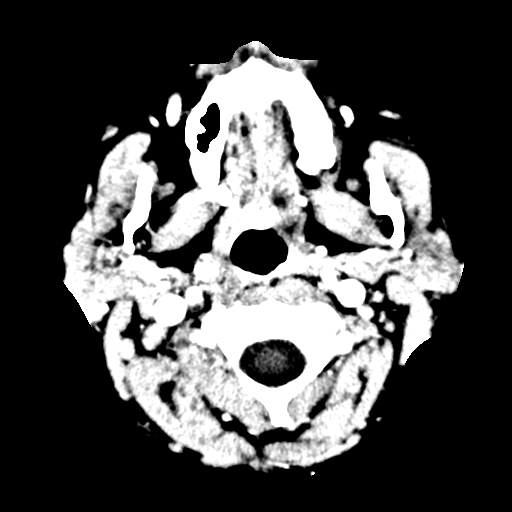
[im 8/38  brain]
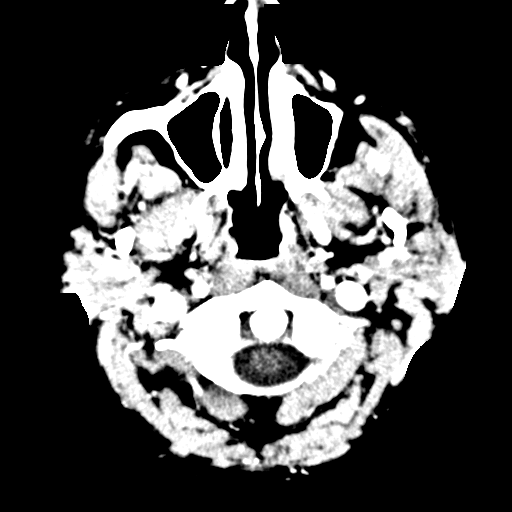

[15 of 30 positions shown; findings below may reference images not displayed]

FINDINGS: Mild mucosal edema in the maxillary and ethmoid sinuses bilaterally.
No orbital mass. Limited intracranial imaging normal. Normal skull
base.

Right temporal bone: Mastoid sinus well developed and clear without
mucosal edema. Middle ear clear. Ossicles are normal. Tympanic
membrane normal. Internal and external auditory canal normal.
Negative for cholesteatoma. Negative for mass lesion. Cochlea and
semi circular canals normal. Jugular bulb normal. Carotid canal
normal in location.

Left temporal bone: Left myringotomy tube. Mastoid sinus well
developed and clear without mucosal edema. Middle ear clear.
Ossicles are normal. Internal and external auditory canal normal.
Negative for cholesteatoma. Negative for mass lesion. Cochlea and
semi circular canals normal. Jugular bulb normal. Carotid canal
normal in location.
IMPRESSION: 1. Normal right temporal bone
2. Normal left temporal bone without evidence of mastoiditis.
Myringotomy tube on the left.
3. Mild mucosal edema paranasal sinuses

## 2019-10-17 DIAGNOSIS — H7201 Central perforation of tympanic membrane, right ear: Secondary | ICD-10-CM | POA: Insufficient documentation

## 2019-10-17 DIAGNOSIS — H6123 Impacted cerumen, bilateral: Secondary | ICD-10-CM | POA: Insufficient documentation

## 2019-10-17 DIAGNOSIS — H6983 Other specified disorders of Eustachian tube, bilateral: Secondary | ICD-10-CM | POA: Insufficient documentation

## 2020-04-20 ENCOUNTER — Telehealth: Payer: Self-pay | Admitting: Pediatrics

## 2020-04-20 NOTE — Telephone Encounter (Signed)
Mallory Hancock's mom called asking if a note could be written stating that Mallory Hancock is immunocompromised so she can continue her education at home for the time being.  She has been advocating for Mallory Hancock to be able to do homebound learning, but the school has been adamant about needing some sort of documentation from her PCP.  She would also like any good recommendations for female PCPs she can try to get Mallory Hancock established with (not needed until wcc visit on 9/16).

## 2020-04-23 ENCOUNTER — Ambulatory Visit: Payer: Medicaid Other | Admitting: Pediatrics

## 2020-04-28 ENCOUNTER — Telehealth: Payer: Self-pay | Admitting: Pediatrics

## 2020-04-28 NOTE — Telephone Encounter (Signed)
Left msg for mom to call and make appt. For patient .We can schedule her anytime that works for them per [employer]

## 2020-05-25 ENCOUNTER — Telehealth: Payer: Self-pay

## 2020-05-25 NOTE — Telephone Encounter (Signed)
Letter written and printed out. Please email to mother.

## 2020-05-25 NOTE — Telephone Encounter (Signed)
Mother would like you to write a letter to Isabella Stalling, Director of [employer] that child needs to be homebound due to no mask policy at school due to her respiratory issues .You can call mom with any questions Please email to [email or web address]

## 2020-06-23 ENCOUNTER — Encounter: Payer: Self-pay | Admitting: Pediatrics

## 2020-06-23 ENCOUNTER — Other Ambulatory Visit: Payer: Self-pay

## 2020-06-23 ENCOUNTER — Ambulatory Visit (INDEPENDENT_AMBULATORY_CARE_PROVIDER_SITE_OTHER): Payer: Medicaid Other | Admitting: Pediatrics

## 2020-06-23 VITALS — BP 112/70 | Ht 60.0 in | Wt 144.2 lb

## 2020-06-23 DIAGNOSIS — Z0000 Encounter for general adult medical examination without abnormal findings: Secondary | ICD-10-CM

## 2020-06-23 DIAGNOSIS — Q909 Down syndrome, unspecified: Secondary | ICD-10-CM | POA: Diagnosis not present

## 2020-06-23 DIAGNOSIS — Z0001 Encounter for general adult medical examination with abnormal findings: Secondary | ICD-10-CM | POA: Diagnosis not present

## 2020-06-23 DIAGNOSIS — H9193 Unspecified hearing loss, bilateral: Secondary | ICD-10-CM | POA: Insufficient documentation

## 2020-06-23 DIAGNOSIS — Z68.41 Body mass index (BMI) pediatric, 85th percentile to less than 95th percentile for age: Secondary | ICD-10-CM

## 2020-06-23 DIAGNOSIS — Z23 Encounter for immunization: Secondary | ICD-10-CM | POA: Diagnosis not present

## 2020-06-23 LAB — CBC WITH DIFFERENTIAL/PLATELET
Absolute Monocytes: 258 cells/uL (ref 200–950)
Basophils Absolute: 49 cells/uL (ref 0–200)
Hemoglobin: 13.2 g/dL (ref 11.7–15.5)
MPV: 10.5 fL (ref 7.5–12.5)
Platelets: 263 10*3/uL (ref 140–400)
RBC: 4.13 10*6/uL (ref 3.80–5.10)

## 2020-06-23 NOTE — Patient Instructions (Signed)
Preventive Care 72-21 Years Old, Female Preventive care refers to lifestyle choices and visits with your health care provider that can promote health and wellness. At this stage in your life, you may start seeing a primary care physician instead of a pediatrician. Your health care is now your responsibility. Preventive care for young adults includes:  A yearly physical exam. This is also called an annual wellness visit.  Regular dental and eye exams.  Immunizations.  Screening for certain conditions.  Healthy lifestyle choices, such as diet and exercise. What can I expect for my preventive care visit? Physical exam Your health care provider may check:  Height and weight. These may be used to calculate body mass index (BMI), which is a measurement that tells if you are at a healthy weight.  Heart rate and blood pressure.  Body temperature. Counseling Your health care provider may ask you questions about:  Past medical problems and family medical history.  Alcohol, tobacco, and drug use.  Home and relationship well-being.  Access to firearms.  Emotional well-being.  Diet, exercise, and sleep habits.  Sexual activity and sexual health.  Method of birth control.  Menstrual cycle.  Pregnancy history. What immunizations do I need?  Influenza (flu) vaccine  This is recommended every year. Tetanus, diphtheria, and pertussis (Tdap) vaccine  You may need a Td booster every 10 years. Varicella (chickenpox) vaccine  You may need this vaccine if you have not already been vaccinated. Human papillomavirus (HPV) vaccine  If recommended by your health care provider, you may need three doses over 6 months. Measles, mumps, and rubella (MMR) vaccine  You may need at least one dose of MMR. You may also need a second dose. Meningococcal conjugate (MenACWY) vaccine  One dose is recommended if you are 36-32 years old and a Market researcher living in a residence hall,  or if you have one of several medical conditions. You may also need additional booster doses. Pneumococcal conjugate (PCV13) vaccine  You may need this if you have certain conditions and were not previously vaccinated. Pneumococcal polysaccharide (PPSV23) vaccine  You may need one or two doses if you smoke cigarettes or if you have certain conditions. Hepatitis A vaccine  You may need this if you have certain conditions or if you travel or work in places where you may be exposed to hepatitis A. Hepatitis B vaccine  You may need this if you have certain conditions or if you travel or work in places where you may be exposed to hepatitis B. Haemophilus influenzae type b (Hib) vaccine  You may need this if you have certain risk factors. You may receive vaccines as individual doses or as more than one vaccine together in one shot (combination vaccines). Talk with your health care provider about the risks and benefits of combination vaccines. What tests do I need? Blood tests  Lipid and cholesterol levels. These may be checked every 5 years starting at age 40.  Hepatitis C test.  Hepatitis B test. Screening  Pelvic exam and Pap test. This may be done every 3 years starting at age 22.  Sexually transmitted disease (STD) testing, if you are at risk.  BRCA-related cancer screening. This may be done if you have a family history of breast, ovarian, tubal, or peritoneal cancers. Other tests  Tuberculosis skin test.  Vision and hearing tests.  Skin exam.  Breast exam. Follow these instructions at home: Eating and drinking   Eat a diet that includes fresh fruits and  vegetables, whole grains, lean protein, and low-fat dairy products.  Drink enough fluid to keep your urine pale yellow.  Do not drink alcohol if: ? Your health care provider tells you not to drink. ? You are pregnant, may be pregnant, or are planning to become pregnant. ? You are under the legal drinking age. In the  [place] the legal drinking age is 62.  If you drink alcohol: ? Limit how much you have to 0-1 drink a day. ? Be aware of how much alcohol is in your drink. In the [place] one drink equals one 12 oz bottle of beer (355 mL), one 5 oz glass of wine (148 mL), or one 1 oz glass of hard liquor (44 mL). Lifestyle  Take daily care of your teeth and gums.  Stay active. Exercise at least 30 minutes 5 or more days of the week.  Do not use any products that contain nicotine or tobacco, such as cigarettes, e-cigarettes, and chewing tobacco. If you need help quitting, ask your health care provider.  Do not use drugs.  If you are sexually active, practice safe sex. Use a condom or other form of birth control (contraception) in order to prevent pregnancy and STIs (sexually transmitted infections). If you plan to become pregnant, see your health care provider for a pre-conception visit.  Find healthy ways to cope with stress, such as: ? Meditation, yoga, or listening to music. ? Journaling. ? Talking to a trusted person. ? Spending time with friends and family. Safety  Always wear your seat belt while driving or riding in a vehicle.  Do not drive if you have been drinking alcohol. Do not ride with someone who has been drinking.  Do not drive when you are tired or distracted. Do not text while driving.  Wear a helmet and other protective equipment during sports activities.  If you have firearms in your house, make sure you follow all gun safety procedures.  Seek help if you have been bullied, physically abused, or sexually abused.  Use the Internet responsibly to avoid dangers such as online bullying and online sex predators. What's next?  Go to your health care provider once a year for a well check visit.  Ask your health care provider how often you should have your eyes and teeth checked.  Stay up to date on all vaccines. This information is not intended to replace advice given to you by  your health care provider. Make sure you discuss any questions you have with your health care provider. Document Revised: 07/19/2018 Document Reviewed: 07/19/2018 Elsevier Patient Education  2020 [employer].

## 2020-06-23 NOTE — Progress Notes (Signed)
Subjective:     Mallory Hancock is a 19 y.o. female and is here for a comprehensive physical exam. The patient reports:.  -older sister  -cardiac arrest, ICU  -flatlined 5 times  -survived  -history of ETOH abuse -Maternal grandmother  -stroke in August  -poor recovery -Lots of stress and anxiety  -therapy -has had a lot of digestion problems  -will have BM and feel better  -has increased in the past year   Social History   Socioeconomic History  . Marital status: Single    Spouse name: Not on file  . Number of children: Not on file  . Years of education: Not on file  . Highest education level: Not on file  Occupational History  . Not on file  Tobacco Use  . Smoking status: Never Smoker  . Smokeless tobacco: Never Used  Vaping Use  . Vaping Use: Never used  Substance and Sexual Activity  . Alcohol use: No  . Drug use: No  . Sexual activity: Never    Birth control/protection: Abstinence  Other Topics Concern  . Not on file  Social History Narrative   Lives with mom and dad, one older half siblings   Home schooled   Supportive home environment, independent self care   Social opportunities thru family, school groups, community   MGF passed away in 11-17-13- family aware of [facility] program thru Hospice if needed.   Social Determinants of Health   Financial Resource Strain:   . Difficulty of Paying Living Expenses: Not on file  Food Insecurity:   . Worried About Programme researcher, broadcasting/film/video in the Last Year: Not on file  . Ran Out of Food in the Last Year: Not on file  Transportation Needs:   . Lack of Transportation (Medical): Not on file  . Lack of Transportation (Non-Medical): Not on file  Physical Activity:   . Days of Exercise per Week: Not on file  . Minutes of Exercise per Session: Not on file  Stress:   . Feeling of Stress : Not on file  Social Connections:   . Frequency of Communication with Friends and Family: Not on file  . Frequency of Social  Gatherings with Friends and Family: Not on file  . Attends Religious Services: Not on file  . Active Member of Clubs or Organizations: Not on file  . Attends Banker Meetings: Not on file  . Marital Status: Not on file  Intimate Partner Violence:   . Fear of Current or Ex-Partner: Not on file  . Emotionally Abused: Not on file  . Physically Abused: Not on file  . Sexually Abused: Not on file   Health Maintenance  Topic Date Due  . Hepatitis C Screening  Never done  . HIV Screening  Never done  . PAP-Cervical Cytology Screening  05/01/2020  . PAP SMEAR-Modifier  05/01/2020  . TETANUS/TDAP  03/30/2021  . INFLUENZA VACCINE  Discontinued    The following portions of the patient's history were reviewed and updated as appropriate: allergies, current medications, past family history, past medical history, past social history, past surgical history and problem list.  Review of Systems Pertinent items are noted in HPI.   Objective:    BP 112/70   Ht 5' (1.524 m)   Wt 144 lb 4 oz (65.4 kg)   BMI 28.17 kg/m  General appearance: alert, cooperative, appears stated age and no distress Head: Normocephalic, without obvious abnormality, atraumatic Eyes: conjunctivae/corneas clear. PERRL, EOM's intact. Fundi  benign. Ears: normal TM's and external ear canals both ears Nose: Nares normal. Septum midline. Mucosa normal. No drainage or sinus tenderness. Throat: lips, mucosa, and tongue normal; teeth and gums normal Neck: no adenopathy, no carotid bruit, no JVD, supple, symmetrical, trachea midline and thyroid not enlarged, symmetric, no tenderness/mass/nodules Lungs: clear to auscultation bilaterally Heart: regular rate and rhythm, S1, S2 normal, no murmur, click, rub or gallop Abdomen: soft, non-tender; bowel sounds normal; no masses,  no organomegaly Extremities: extremities normal, atraumatic, no cyanosis or edema Skin: Skin color, texture, turgor normal. No rashes or  lesions Lymph nodes: Cervical, supraclavicular, and axillary nodes normal. Neurologic: Grossly normal    Assessment:    Healthy female exam.      Plan:    Labs ordered- TSH, free T4, CBC, Vitamin D, CMET MenB vaccine per orders. Indications, contraindications and side effects of vaccine/vaccines discussed with parent and parent verbally expressed understanding and also agreed with the administration of vaccine/vaccines as ordered above today.Handout (VIS) given for each vaccine at this visit. See After Visit Summary for Counseling Recommendations

## 2020-06-24 LAB — COMPREHENSIVE METABOLIC PANEL
AG Ratio: 1.2 (calc) (ref 1.0–2.5)
ALT: 15 U/L (ref 6–29)
AST: 16 U/L (ref 10–30)
Albumin: 4.1 g/dL (ref 3.6–5.1)
Alkaline phosphatase (APISO): 156 U/L — ABNORMAL HIGH (ref 31–125)
BUN: 10 mg/dL (ref 7–25)
CO2: 25 mmol/L (ref 20–32)
Calcium: 9.2 mg/dL (ref 8.6–10.2)
Chloride: 103 mmol/L (ref 98–110)
Creat: 0.8 mg/dL (ref 0.50–1.10)
Globulin: 3.5 g/dL (calc) (ref 1.9–3.7)
Glucose, Bld: 89 mg/dL (ref 65–139)
Potassium: 4.4 mmol/L (ref 3.5–5.3)
Sodium: 138 mmol/L (ref 135–146)
Total Bilirubin: 0.4 mg/dL (ref 0.2–1.2)
Total Protein: 7.6 g/dL (ref 6.1–8.1)

## 2020-06-24 LAB — TSH: TSH: 3.16 mIU/L

## 2020-06-24 LAB — CBC WITH DIFFERENTIAL/PLATELET
Basophils Relative: 1.3 %
Eosinophils Absolute: 30 cells/uL (ref 15–500)
Eosinophils Relative: 0.8 %
HCT: 39 % (ref 35.0–45.0)
Lymphs Abs: 1387 cells/uL (ref 850–3900)
MCH: 32 pg (ref 27.0–33.0)
MCHC: 33.8 g/dL (ref 32.0–36.0)
MCV: 94.4 fL (ref 80.0–100.0)
Monocytes Relative: 6.8 %
Neutro Abs: 2075 cells/uL (ref 1500–7800)
Neutrophils Relative %: 54.6 %
RDW: 12.8 % (ref 11.0–15.0)
Total Lymphocyte: 36.5 %
WBC: 3.8 10*3/uL (ref 3.8–10.8)

## 2020-06-24 LAB — VITAMIN D 25 HYDROXY (VIT D DEFICIENCY, FRACTURES): Vit D, 25-Hydroxy: 26 ng/mL — ABNORMAL LOW (ref 30–100)

## 2020-06-24 LAB — T4, FREE: Free T4: 1 ng/dL (ref 0.8–1.8)

## 2020-06-25 ENCOUNTER — Telehealth: Payer: Self-pay | Admitting: Pediatrics

## 2020-06-25 NOTE — Telephone Encounter (Signed)
Left generic message, requested call back to discuss lab results (vitamin D low, everything else WNL).

## 2020-06-26 NOTE — Telephone Encounter (Signed)
Discussed lab results with mom. Vitamin D levels at deficient levels. Recommended sunlight exposure, increasing foods high in vitamin D, OTC vitamin D supplement with at least 500IU. Mom verbalized understanding and agreement.

## 2020-06-26 NOTE — Telephone Encounter (Signed)
Mother called back and is asking for another call to discuss results. Mom didn't answer because she did not recognize the number.

## 2021-06-01 ENCOUNTER — Telehealth: Payer: Self-pay | Admitting: Pediatrics

## 2021-06-01 NOTE — Telephone Encounter (Signed)
Returned call, no answer, no voicemail.

## 2021-06-01 NOTE — Telephone Encounter (Signed)
Mother called and stated that she would like to speak with Larita Fife about some suggestions for an Chief Financial Officer for [employer].

## 2022-02-09 ENCOUNTER — Encounter (HOSPITAL_COMMUNITY): Payer: Self-pay | Admitting: Orthopedic Surgery

## 2022-02-09 ENCOUNTER — Other Ambulatory Visit (HOSPITAL_COMMUNITY): Payer: Self-pay | Admitting: Orthopedic Surgery

## 2022-02-09 ENCOUNTER — Other Ambulatory Visit: Payer: Self-pay

## 2022-02-09 NOTE — Progress Notes (Signed)
PEDS - Dr Lucky Rathke  Cardiologist - n/a  Chest x-ray - n/a EKG - n/a Stress Test - n/a ECHO - n/a Cardiac Cath - n/a  ICD Pacemaker/Loop - n/a  Sleep Study -  n/a CPAP - none  ERAS: Clear liquids til 11:30 AM DOS  STOP now taking any Aspirin (unless otherwise instructed by your surgeon), Aleve, Naproxen, Ibuprofen, Motrin, Advil, Goody's, BC's, all herbal medications, fish oil, and all vitamins.   Coronavirus Screening Does the patient have any of the following symptoms:  Cough yes/no: No Fever (>100.73F)  yes/no: No Runny nose yes/no: No Sore throat yes/no: No Difficulty breathing/shortness of breath  yes/no: No  Have you traveled in the last 14 days and where? yes/no: No  Mother Tameaka Eichhorn verbalized understanding of instructions that were given via phone.

## 2022-02-10 ENCOUNTER — Ambulatory Visit (HOSPITAL_COMMUNITY): Payer: Medicaid Other | Admitting: Anesthesiology

## 2022-02-10 ENCOUNTER — Other Ambulatory Visit: Payer: Self-pay

## 2022-02-10 ENCOUNTER — Ambulatory Visit (HOSPITAL_BASED_OUTPATIENT_CLINIC_OR_DEPARTMENT_OTHER): Payer: Medicaid Other | Admitting: Anesthesiology

## 2022-02-10 ENCOUNTER — Ambulatory Visit (HOSPITAL_COMMUNITY)
Admission: RE | Admit: 2022-02-10 | Discharge: 2022-02-10 | Disposition: A | Discharge status: Home / Self Care | Payer: Medicaid Other | Source: Ambulatory Visit | Attending: Orthopedic Surgery | Admitting: Orthopedic Surgery

## 2022-02-10 ENCOUNTER — Encounter (HOSPITAL_COMMUNITY): Payer: Self-pay | Admitting: Orthopedic Surgery

## 2022-02-10 ENCOUNTER — Ambulatory Visit (HOSPITAL_COMMUNITY): Payer: Medicaid Other

## 2022-02-10 ENCOUNTER — Encounter (HOSPITAL_COMMUNITY)
Admission: RE | Disposition: A | Discharge status: Home / Self Care | Payer: Self-pay | Source: Ambulatory Visit | Attending: Orthopedic Surgery

## 2022-02-10 DIAGNOSIS — S82841A Displaced bimalleolar fracture of right lower leg, initial encounter for closed fracture: Secondary | ICD-10-CM

## 2022-02-10 DIAGNOSIS — W19XXXA Unspecified fall, initial encounter: Secondary | ICD-10-CM | POA: Diagnosis not present

## 2022-02-10 DIAGNOSIS — K219 Gastro-esophageal reflux disease without esophagitis: Secondary | ICD-10-CM | POA: Diagnosis not present

## 2022-02-10 DIAGNOSIS — Q909 Down syndrome, unspecified: Secondary | ICD-10-CM | POA: Diagnosis not present

## 2022-02-10 DIAGNOSIS — Y939 Activity, unspecified: Secondary | ICD-10-CM | POA: Diagnosis not present

## 2022-02-10 HISTORY — DX: Unspecified sensorineural hearing loss: H90.5

## 2022-02-10 HISTORY — DX: Gastro-esophageal reflux disease without esophagitis: K21.9

## 2022-02-10 HISTORY — PX: ORIF ANKLE FRACTURE: SHX5408

## 2022-02-10 LAB — CBC
HCT: 37.6 % (ref 36.0–46.0)
Hemoglobin: 12.5 g/dL (ref 12.0–15.0)
MCH: 31.7 pg (ref 26.0–34.0)
MCHC: 33.2 g/dL (ref 30.0–36.0)
MCV: 95.4 fL (ref 80.0–100.0)
Platelets: 232 10*3/uL (ref 150–400)
RBC: 3.94 MIL/uL (ref 3.87–5.11)
RDW: 13.9 % (ref 11.5–15.5)
WBC: 3.8 10*3/uL — ABNORMAL LOW (ref 4.0–10.5)
nRBC: 0 % (ref 0.0–0.2)

## 2022-02-10 LAB — SURGICAL PCR SCREEN
MRSA, PCR: NEGATIVE
Staphylococcus aureus: NEGATIVE

## 2022-02-10 LAB — POCT PREGNANCY, URINE: Preg Test, Ur: NEGATIVE

## 2022-02-10 SURGERY — OPEN REDUCTION INTERNAL FIXATION (ORIF) ANKLE FRACTURE
Anesthesia: Regional | Site: Ankle | Laterality: Right

## 2022-02-10 MED ORDER — EPHEDRINE SULFATE-NACL 50-0.9 MG/10ML-% IV SOSY
PREFILLED_SYRINGE | INTRAVENOUS | Status: DC | PRN
  Administered 2022-02-10 (×3): 10 mg via INTRAVENOUS

## 2022-02-10 MED ORDER — VANCOMYCIN HCL 500 MG IV SOLR
INTRAVENOUS | Status: DC | PRN
  Administered 2022-02-10: 500 mg

## 2022-02-10 MED ORDER — MIDAZOLAM HCL 2 MG/2ML IJ SOLN
INTRAMUSCULAR | Status: AC
Start: 2022-02-10 — End: ?
  Filled 2022-02-10: qty 2

## 2022-02-10 MED ORDER — ONDANSETRON HCL 4 MG/2ML IJ SOLN
INTRAMUSCULAR | Status: AC
  Filled 2022-02-10: qty 2

## 2022-02-10 MED ORDER — PROPOFOL 1000 MG/100ML IV EMUL
INTRAVENOUS | Status: AC
  Filled 2022-02-10: qty 100

## 2022-02-10 MED ORDER — MIDAZOLAM HCL 2 MG/2ML IJ SOLN
INTRAMUSCULAR | Status: DC | PRN
  Administered 2022-02-10: 2 mg via INTRAVENOUS

## 2022-02-10 MED ORDER — LIDOCAINE 2% (20 MG/ML) 5 ML SYRINGE
INTRAMUSCULAR | Status: DC | PRN
  Administered 2022-02-10: 40 mg via INTRAVENOUS

## 2022-02-10 MED ORDER — ACETAMINOPHEN 10 MG/ML IV SOLN
INTRAVENOUS | Status: AC
  Filled 2022-02-10: qty 100

## 2022-02-10 MED ORDER — SODIUM CHLORIDE 0.9 % IV SOLN: INTRAVENOUS | Status: DC

## 2022-02-10 MED ORDER — VANCOMYCIN HCL 500 MG IV SOLR
INTRAVENOUS | Status: AC
  Filled 2022-02-10: qty 10

## 2022-02-10 MED ORDER — ORAL CARE MOUTH RINSE: 15.0000 mL | Freq: Once | OROMUCOSAL | Status: AC

## 2022-02-10 MED ORDER — FENTANYL CITRATE (PF) 100 MCG/2ML IJ SOLN: 25.0000 ug | INTRAMUSCULAR | Status: DC | PRN

## 2022-02-10 MED ORDER — DEXAMETHASONE SODIUM PHOSPHATE 10 MG/ML IJ SOLN
INTRAMUSCULAR | Status: DC | PRN
  Administered 2022-02-10: 8 mg via INTRAVENOUS

## 2022-02-10 MED ORDER — DEXAMETHASONE SODIUM PHOSPHATE 10 MG/ML IJ SOLN
INTRAMUSCULAR | Status: DC | PRN
  Administered 2022-02-10 (×2): 5 mg

## 2022-02-10 MED ORDER — FENTANYL CITRATE (PF) 250 MCG/5ML IJ SOLN
INTRAMUSCULAR | Status: AC
  Filled 2022-02-10: qty 5

## 2022-02-10 MED ORDER — CHLORHEXIDINE GLUCONATE 0.12 % MT SOLN
15.0000 mL | Freq: Once | OROMUCOSAL | Status: AC
  Administered 2022-02-10: 15 mL via OROMUCOSAL
  Filled 2022-02-10: qty 15

## 2022-02-10 MED ORDER — GLYCOPYRROLATE PF 0.2 MG/ML IJ SOSY
PREFILLED_SYRINGE | INTRAMUSCULAR | Status: DC | PRN
  Administered 2022-02-10: .2 mg via INTRAVENOUS

## 2022-02-10 MED ORDER — ROPIVACAINE HCL 5 MG/ML IJ SOLN
INTRAMUSCULAR | Status: DC | PRN
  Administered 2022-02-10 (×2): 20 mL via PERINEURAL

## 2022-02-10 MED ORDER — ACETAMINOPHEN 10 MG/ML IV SOLN
INTRAVENOUS | Status: DC | PRN
  Administered 2022-02-10: 1000 mg via INTRAVENOUS

## 2022-02-10 MED ORDER — ONDANSETRON HCL 4 MG/2ML IJ SOLN
INTRAMUSCULAR | Status: DC | PRN
  Administered 2022-02-10: 4 mg via INTRAVENOUS

## 2022-02-10 MED ORDER — HYDROCODONE-ACETAMINOPHEN 5-325 MG PO TABS: 1.0000 | ORAL_TABLET | Freq: Four times a day (QID) | ORAL | 0 refills | Status: AC | PRN

## 2022-02-10 MED ORDER — FENTANYL CITRATE (PF) 250 MCG/5ML IJ SOLN
INTRAMUSCULAR | Status: DC | PRN
  Administered 2022-02-10 (×3): 25 ug via INTRAVENOUS
  Administered 2022-02-10: 50 ug via INTRAVENOUS

## 2022-02-10 MED ORDER — CEFAZOLIN SODIUM-DEXTROSE 2-4 GM/100ML-% IV SOLN
2.0000 g | INTRAVENOUS | Status: AC
  Administered 2022-02-10: 2 g via INTRAVENOUS
  Filled 2022-02-10: qty 100

## 2022-02-10 MED ORDER — PROPOFOL 10 MG/ML IV BOLUS
INTRAVENOUS | Status: AC
  Filled 2022-02-10: qty 20

## 2022-02-10 MED ORDER — LACTATED RINGERS IV SOLN: INTRAVENOUS | Status: DC

## 2022-02-10 MED ORDER — PROPOFOL 10 MG/ML IV BOLUS
INTRAVENOUS | Status: DC | PRN
  Administered 2022-02-10: 200 mg via INTRAVENOUS

## 2022-02-10 SURGICAL SUPPLY — 53 items
ALCOHOL 70% 16 OZ (MISCELLANEOUS) ×2 IMPLANT
BAG COUNTER SPONGE SURGICOUNT (BAG) ×1 IMPLANT
BANDAGE ESMARK 6X9 LF (GAUZE/BANDAGES/DRESSINGS) ×1 IMPLANT
BIT DRILL 2.9 CANN QC NONSTRL (BIT) ×1 IMPLANT
BLADE SURG 15 STRL LF DISP TIS (BLADE) ×1 IMPLANT
BLADE SURG 15 STRL SS (BLADE) ×1
BNDG COHESIVE 4X5 TAN STRL (GAUZE/BANDAGES/DRESSINGS) ×1 IMPLANT
BNDG COHESIVE 6X5 TAN STRL LF (GAUZE/BANDAGES/DRESSINGS) ×1 IMPLANT
BNDG ESMARK 6X9 LF (GAUZE/BANDAGES/DRESSINGS) ×2
CANISTER SUCT 3000ML PPV (MISCELLANEOUS) ×2 IMPLANT
CHLORAPREP W/TINT 26 (MISCELLANEOUS) ×4 IMPLANT
COVER SURGICAL LIGHT HANDLE (MISCELLANEOUS) ×2 IMPLANT
CUFF TOURN SGL QUICK 34 (TOURNIQUET CUFF) ×1
CUFF TOURN SGL QUICK 42 (TOURNIQUET CUFF) IMPLANT
CUFF TRNQT CYL 34X4.125X (TOURNIQUET CUFF) ×1 IMPLANT
DRAPE OEC MINIVIEW 54X84 (DRAPES) ×2 IMPLANT
DRAPE U-SHAPE 47X51 STRL (DRAPES) ×2 IMPLANT
DRSG MEPITEL 4X7.2 (GAUZE/BANDAGES/DRESSINGS) ×2 IMPLANT
DRSG PAD ABDOMINAL 8X10 ST (GAUZE/BANDAGES/DRESSINGS) ×4 IMPLANT
ELECT REM PT RETURN 9FT ADLT (ELECTROSURGICAL) ×2
ELECTRODE REM PT RTRN 9FT ADLT (ELECTROSURGICAL) ×1 IMPLANT
GAUZE SPONGE 4X4 12PLY STRL (GAUZE/BANDAGES/DRESSINGS) IMPLANT
GLOVE BIO SURGEON STRL SZ8 (GLOVE) ×2 IMPLANT
GLOVE BIOGEL PI IND STRL 8 (GLOVE) ×1 IMPLANT
GLOVE BIOGEL PI INDICATOR 8 (GLOVE) ×1
GLOVE ECLIPSE 8.0 STRL XLNG CF (GLOVE) ×2 IMPLANT
GOWN STRL REUS W/ TWL LRG LVL3 (GOWN DISPOSABLE) ×1 IMPLANT
GOWN STRL REUS W/ TWL XL LVL3 (GOWN DISPOSABLE) ×2 IMPLANT
GOWN STRL REUS W/TWL LRG LVL3 (GOWN DISPOSABLE) ×1
GOWN STRL REUS W/TWL XL LVL3 (GOWN DISPOSABLE) ×2
K-WIRE ACE 1.6X6 (WIRE) ×4
KIT BASIN OR (CUSTOM PROCEDURE TRAY) ×2 IMPLANT
KIT TURNOVER KIT B (KITS) ×2 IMPLANT
KWIRE ACE 1.6X6 (WIRE) IMPLANT
NS IRRIG 1000ML POUR BTL (IV SOLUTION) ×2 IMPLANT
PACK ORTHO EXTREMITY (CUSTOM PROCEDURE TRAY) ×2 IMPLANT
PAD ARMBOARD 7.5X6 YLW CONV (MISCELLANEOUS) ×4 IMPLANT
PAD CAST 4YDX4 CTTN HI CHSV (CAST SUPPLIES) ×1 IMPLANT
PADDING CAST COTTON 4X4 STRL (CAST SUPPLIES) ×1
SCREW ACE CAN 4.0 36M (Screw) ×1 IMPLANT
SCREW ACE CAN 4.0 40M (Screw) ×2 IMPLANT
SPLINT PLASTER CAST XFAST 5X30 (CAST SUPPLIES) IMPLANT
SPLINT PLASTER XFAST SET 5X30 (CAST SUPPLIES) ×20
SPONGE T-LAP 18X18 ~~LOC~~+RFID (SPONGE) ×2 IMPLANT
SUCTION FRAZIER HANDLE 10FR (MISCELLANEOUS) ×1
SUCTION TUBE FRAZIER 10FR DISP (MISCELLANEOUS) ×1 IMPLANT
SUT ETHILON 3 0 PS 1 (SUTURE) ×2 IMPLANT
SUT MNCRL AB 3-0 PS2 18 (SUTURE) IMPLANT
SUT VIC AB 2-0 CT1 27 (SUTURE) ×2
SUT VIC AB 2-0 CT1 TAPERPNT 27 (SUTURE) ×2 IMPLANT
TOWEL GREEN STERILE (TOWEL DISPOSABLE) ×2 IMPLANT
TOWEL GREEN STERILE FF (TOWEL DISPOSABLE) ×2 IMPLANT
TUBE CONNECTING 12X1/4 (SUCTIONS) ×2 IMPLANT

## 2022-02-10 NOTE — Anesthesia Preprocedure Evaluation (Signed)
Anesthesia Evaluation  Patient identified by MRN, date of birth, ID band Patient awake    Reviewed: Allergy & Precautions, NPO status , Patient's Chart, lab work & pertinent test results  Airway Mallampati: III  TM Distance: >3 FB Neck ROM: Full    Dental no notable dental hx.    Pulmonary neg pulmonary ROS,    Pulmonary exam normal breath sounds clear to auscultation       Cardiovascular negative cardio ROS Normal cardiovascular exam Rhythm:Regular Rate:Normal     Neuro/Psych negative neurological ROS  negative psych ROS   GI/Hepatic Neg liver ROS, GERD  ,  Endo/Other  negative endocrine ROS  Renal/GU negative Renal ROS  negative genitourinary   Musculoskeletal negative musculoskeletal ROS (+)   Abdominal   Peds Down's syndrome   Hematology negative hematology ROS (+)   Anesthesia Other Findings   Reproductive/Obstetrics                             Anesthesia Physical Anesthesia Plan  ASA: 2  Anesthesia Plan: General and Regional   Post-op Pain Management: Regional block* and Ofirmev IV (intra-op)*   Induction: Intravenous  PONV Risk Score and Plan: 3 and Ondansetron, Dexamethasone and Midazolam  Airway Management Planned: LMA  Additional Equipment:   Intra-op Plan:   Post-operative Plan: Extubation in OR  Informed Consent: I have reviewed the patients History and Physical, chart, labs and discussed the procedure including the risks, benefits and alternatives for the proposed anesthesia with the patient or authorized representative who has indicated his/her understanding and acceptance.     Dental advisory given  Plan Discussed with: CRNA  Anesthesia Plan Comments:         Anesthesia Quick Evaluation

## 2022-02-10 NOTE — Anesthesia Procedure Notes (Addendum)
  Anesthesia Regional Block: Adductor canal block   Pre-Anesthetic Checklist: , timeout performed,  Correct Patient, Correct Site, Correct Laterality,  Correct Procedure, Correct Position, site marked,  Risks and benefits discussed,  Surgical consent,  Pre-op evaluation,  At surgeon's request and post-op pain management  Laterality: Right  Prep: Maximum Sterile Barrier Precautions used, chloraprep       Needles:  Injection technique: Single-shot  Needle Type: Echogenic Stimulator Needle     Needle Length: 9cm  Needle Gauge: 22     Additional Needles:   Procedures:,,,, ultrasound used (permanent image in chart),,    Narrative:  Start time: 02/10/2022 2:40 PM End time: 02/10/2022 2:45 PM Injection made incrementally with aspirations every 5 mL.  Performed by: Personally  Anesthesiologist: Lannie Fields, DO  Additional Notes: Monitors applied. No increased pain on injection. No increased resistance to injection. Injection made in 5cc increments. Good needle visualization. Patient tolerated procedure well.

## 2022-02-10 NOTE — Anesthesia Procedure Notes (Signed)
Procedure Name: LMA Insertion Date/Time: 02/10/2022 2:35 PM  Performed by: Lelon Perla, CRNAPre-anesthesia Checklist: Patient identified, Emergency Drugs available, Suction available and Patient being monitored Patient Re-evaluated:Patient Re-evaluated prior to induction Oxygen Delivery Method: Circle System Utilized Preoxygenation: Pre-oxygenation with 100% oxygen Induction Type: IV induction Ventilation: Mask ventilation without difficulty LMA: LMA inserted LMA Size: 4.0 Number of attempts: 1 Airway Equipment and Method: Bite block Placement Confirmation: positive ETCO2 Tube secured with: Tape Dental Injury: Teeth and Oropharynx as per pre-operative assessment

## 2022-02-10 NOTE — Anesthesia Procedure Notes (Addendum)
  Anesthesia Regional Block: Popliteal block   Pre-Anesthetic Checklist: , timeout performed,  Correct Patient, Correct Site, Correct Laterality,  Correct Procedure, Correct Position, site marked,  Risks and benefits discussed,  Surgical consent,  Pre-op evaluation,  At surgeon's request and post-op pain management  Laterality: Right  Prep: Maximum Sterile Barrier Precautions used, chloraprep       Needles:  Injection technique: Single-shot  Needle Type: Echogenic Stimulator Needle     Needle Length: 9cm  Needle Gauge: 22     Additional Needles:   Procedures:,,,, ultrasound used (permanent image in chart),,    Narrative:  Start time: 02/10/2022 2:35 PM End time: 02/10/2022 2:40 PM Injection made incrementally with aspirations every 5 mL.  Performed by: Personally  Anesthesiologist: Lannie Fields, DO  Additional Notes: Monitors applied. No increased pain on injection. No increased resistance to injection. Injection made in 5cc increments. Good needle visualization. Patient tolerated procedure well.

## 2022-02-10 NOTE — Transfer of Care (Signed)
Immediate Anesthesia Transfer of Care Note  Patient: Mallory Hancock  Procedure(s) Performed: OPEN REDUCTION INTERNAL FIXATION (ORIF) ANKLE FRACTURE (Right: Ankle)  Patient Location: PACU  Anesthesia Type:General  Level of Consciousness: drowsy  Airway & Oxygen Therapy: Patient Spontanous Breathing and Patient connected to face mask  Post-op Assessment: Report given to RN and Post -op Vital signs reviewed and stable  Post vital signs: Reviewed and stable  Last Vitals:  Vitals Value Taken Time  BP 124/55 02/10/22 1615  Temp    Pulse 88 02/10/22 1619  Resp 13 02/10/22 1619  SpO2 100 % 02/10/22 1619  Vitals shown include unvalidated device data.  Last Pain:  Vitals:   02/10/22 1313  TempSrc:   PainSc: 10-Worst pain ever      Patients Stated Pain Goal: 2 (02/10/22 1313)  Complications: No notable events documented.

## 2022-02-10 NOTE — Discharge Instructions (Signed)
Toni Arthurs, MD [facility]  Please read the following information regarding your care after surgery.  Medications  You only need a prescription for the narcotic pain medicine (ex. oxycodone, Percocet, Norco).  All of the other medicines listed below are available over the counter. X Aleve 2 pills twice a day for the first 3 days after surgery. X hydrocodone as prescribed for severe pain  X To help prevent blood clots, take a baby aspirin (81 mg) twice a day for two weeks after surgery.  You should also get up every hour while you are awake to move around.    Weight Bearing X Do not bear any weight on the operated leg or foot.  Cast / Splint / Dressing X Keep your splint, cast or dressing clean and dry.  Don't put anything (coat hanger, pencil, etc) down inside of it.  If it gets damp, use a hair dryer on the cool setting to dry it.  If it gets soaked, call the office to schedule an appointment for a cast change.  After your dressing, cast or splint is removed; you may shower, but do not soak or scrub the wound.  Allow the water to run over it, and then gently pat it dry.  Swelling It is normal for you to have swelling where you had surgery.  To reduce swelling and pain, keep your toes above your nose for at least 3 days after surgery.  It may be necessary to keep your foot or leg elevated for several weeks.  If it hurts, it should be elevated.  Follow Up Call my office at [phone] when you are discharged from the hospital or surgery center to schedule an appointment to be seen two weeks after surgery.  Call my office at [phone] if you develop a fever >101.5 F, nausea, vomiting, bleeding from the surgical site or severe pain.

## 2022-02-10 NOTE — H&P (Signed)
Mallory Hancock is an 21 y.o. female.   Chief Complaint: Right ankle injury HPI: 21 year old female who injured her right ankle 2 days ago and a fall.  She has a bimalleolar fracture and presents today for surgical treatment of this displaced and unstable ankle injury.  Past Medical History:  Diagnosis Date   Acne    Differin from dermatologist   Allergy    Claritin PRN - patient has not had to take claritin in years per mother 02/09/22   GERD (gastroesophageal reflux disease)    diet controlled   Hydradenitis    Sensorineural hearing loss (SNHL)    bilateral   Trisomy 21    Down syndrome    Past Surgical History:  Procedure Laterality Date   ADENOIDECTOMY     TONSILLECTOMY     TYMPANOSTOMY TUBE PLACEMENT      Family History  Problem Relation Age of Onset   ALS Maternal Uncle    Kidney disease Father        kidney stones   Cancer Father        testicular cancer, full recovery   Arthritis Maternal Grandmother    Hearing loss Maternal Grandmother    Cancer Maternal Grandfather        colon cancer   Hypertension Maternal Grandfather    Alcohol abuse Neg Hx    Asthma Neg Hx    Birth defects Neg Hx    COPD Neg Hx    Depression Neg Hx    Diabetes Neg Hx    Drug abuse Neg Hx    Early death Neg Hx    Heart disease Neg Hx    Hyperlipidemia Neg Hx    Learning disabilities Neg Hx    Mental illness Neg Hx    Mental retardation Neg Hx    Miscarriages / Stillbirths Neg Hx    Stroke Neg Hx    Vision loss Neg Hx    Varicose Veins Neg Hx    Social History:  reports that she has never smoked. She has never used smokeless tobacco. She reports that she does not drink alcohol and does not use drugs.  Allergies:  Allergies  Allergen Reactions   Penicillins Rash and Hives   Tobramycin-Dexamethasone Other (See Comments)    Ear drops caused "sore throat, neck pain/swelling" (per mom)    Medications Prior to Admission  Medication Sig Dispense Refill   HOMEOPATHIC PRODUCTS PO  Take 20 drops by mouth daily as needed (mucus/congestion.). Lymphatic Drainage Drops Supplement     HONEY PO Take 5 mLs by mouth daily. Manuka honey     ibuprofen (ADVIL) 200 MG tablet Take 200 mg by mouth every 8 (eight) hours as needed (pain.).     OVER THE COUNTER MEDICATION Take 1 Scoop by mouth in the morning. Nitric Oxide Supplement  (Mix 1 scoop of powder into 8 ounces of water)      Results for orders placed or performed during the hospital encounter of 02/10/22 (from the past 48 hour(s))  Pregnancy, urine POC     Status: None   Collection Time: 02/10/22 12:23 PM  Result Value Ref Range   Preg Test, Ur NEGATIVE NEGATIVE    Comment:        THE SENSITIVITY OF THIS METHODOLOGY IS >24 mIU/mL    No results found.  Review of Systems no recent fever, chills, nausea, vomiting or changes in her appetite  Blood pressure 132/64, pulse 68, temperature 98.4 F (36.9 C), temperature source Oral,  resp. rate 18, height 5\' 1"  (1.549 m), weight 70.3 kg, SpO2 100 %. Physical Exam well-nourished well-developed woman in no apparent distress.  Alert and oriented x4.  Normal mood and affect.  Gait is nonweightbearing on the right.  The right ankle is splinted.  The toes have brisk capillary refill and intact sensibility to light touch.  5 out of 5 strength in plantarflexion and dorsiflexion of the toes.  Assessment/Plan Right ankle bimalleolar fracture -to the operating room today for open treatment with internal fixation.  The risks and benefits of the alternative treatment options have been discussed in detail.  The patient wishes to proceed with surgery and specifically understands risks of bleeding, infection, nerve damage, blood clots, need for additional surgery, amputation and death.   , MD 2022-03-09, 12:59 PM

## 2022-02-10 NOTE — Op Note (Signed)
02/10/2022  4:15 PM  PATIENT:  Mallory Hancock  21 y.o. female  PRE-OPERATIVE DIAGNOSIS:  displaced bimalleolar fracture of right lower leg  POST-OPERATIVE DIAGNOSIS:  displaced bimalleolar fracture of right lower leg  Procedure(s): 1.  Open treatment of right ankle bimalleolar fracture with internal fixation 2.  Stress examination of the right ankle under fluoroscopy 3.  AP, mortise and lateral radiographs of the right ankle  SURGEON:  Toni Arthurs, MD  ASSISTANT: None  ANESTHESIA:   General, regional  EBL:  minimal   TOURNIQUET:   Total Tourniquet Time Documented: Thigh (Right) - 48 minutes Total: Thigh (Right) - 48 minutes  COMPLICATIONS:  None apparent  DISPOSITION:  Extubated, awake and stable to recovery.  INDICATION FOR PROCEDURE: 21 year old female fell injuring her right ankle 2 days ago.  She has a displaced bimalleolar fracture.  She presents now for operative treatment of this unstable ankle injury.  The risks and benefits of the alternative treatment options have been discussed in detail.  The patient wishes to proceed with surgery and specifically understands risks of bleeding, infection, nerve damage, blood clots, need for additional surgery, amputation and death.   PROCEDURE IN DETAIL:  After pre operative consent was obtained, and the correct operative site was identified, the patient was brought to the operating room and placed supine on the OR table.  Anesthesia was administered.  Pre-operative antibiotics were administered.  A surgical timeout was taken.  The right lower extremity was prepped and draped in standard sterile fashion with a tourniquet around the thigh.  The extremity was elevated and the tourniquet was inflated to 250 mmHg.  A longitudinal incision was made over the lateral malleolus fracture.  Dissection was carried sharply down through the subcutaneous tissues.  Fracture site was cleaned of all hematoma.  It was reduced and held provisionally with a  tenaculum.  A K wire was inserted from the tip of the fibula into the medullary canal.  AP and lateral radiographs confirmed appropriate position of the guidewire.  It was overdrilled and a 4 mm partially-threaded cannulated screw from the [employer] small frag set was inserted.  It was noted to compress the fracture site appropriately.  Attention was turned to the medial side of the ankle.  A longitudinal incision was made over the medial malleolus.  Dissection was carried sharply down through the subcutaneous tissues.  The fracture site was identified.  It was cleaned of all hematoma and reduced.  A tenaculum was placed to hold the fracture site reduced.  AP and lateral radiographs confirmed appropriate reduction of the fracture.  2 K wires were then inserted from the tip of the medial malleolus across the fracture line into the metaphyseal bone of the distal tibia.  Radiographs confirmed appropriate position of the guidepins.  Both were overdrilled.  4 x 40 mm partially-threaded cannulated screws were inserted.  AP, mortise and lateral radiographs confirmed appropriate reduction of both fractures and appropriate position and length of all hardware.  Stress examination was performed.  There is no instability of the ankle mortise or syndesmosis.  Both wounds were then irrigated copiously and sprinkled with vancomycin powder.  Subcutaneous tissues were approximated with 2-0 Vicryl.  Skin incisions were closed with 3-0 nylon.  Sterile dressings were applied followed by a well-padded short leg splint.  The tourniquet was released after application of the dressings.  The patient was awakened from anesthesia and transported to the recovery room in stable condition.  FOLLOW UP PLAN: Nonweightbearing on the  right lower extremity.  Follow-up in the office in 2 weeks for suture removal and conversion to a short leg cast.  Plan 4 to 6 weeks postoperative nonweightbearing immobilization.  Aspirin for DVT  prophylaxis.   RADIOGRAPHS: AP, mortise and lateral radiographs of the right ankle are obtained intraoperatively.  These show interval reduction and fixation of the medial and lateral malleolus fractures.  Hardware is appropriately positioned and of the appropriate lengths.  No other acute injuries are noted.

## 2022-02-11 ENCOUNTER — Encounter (HOSPITAL_COMMUNITY): Payer: Self-pay | Admitting: Orthopedic Surgery

## 2022-02-11 NOTE — Anesthesia Postprocedure Evaluation (Signed)
Anesthesia Post Note  Patient: Mallory Hancock  Procedure(s) Performed: OPEN REDUCTION INTERNAL FIXATION (ORIF) ANKLE FRACTURE (Right: Ankle)     Patient location during evaluation: PACU Anesthesia Type: Regional and General Level of consciousness: awake and alert Pain management: pain level controlled Vital Signs Assessment: post-procedure vital signs reviewed and stable Respiratory status: spontaneous breathing, nonlabored ventilation, respiratory function stable and patient connected to nasal cannula oxygen Cardiovascular status: blood pressure returned to baseline and stable Postop Assessment: no apparent nausea or vomiting Anesthetic complications: no   No notable events documented.  Last Vitals:  Vitals:   02/10/22 1630 02/10/22 1645  BP: (!) 129/58 (!) 134/56  Pulse: (!) 109 92  Resp: 15 17  Temp:  36.5 C  SpO2: 100% 100%    Last Pain:  Vitals:   02/10/22 1645  TempSrc:   PainSc: 0-No pain   Pain Goal: Patients Stated Pain Goal: 2 (02/10/22 1313)                 Rece Zechman L Tivis Wherry

## 2022-08-11 ENCOUNTER — Emergency Department (HOSPITAL_COMMUNITY): Payer: Medicaid Other

## 2022-08-11 DIAGNOSIS — R1013 Epigastric pain: Secondary | ICD-10-CM | POA: Insufficient documentation

## 2022-08-11 DIAGNOSIS — R1010 Upper abdominal pain, unspecified: Secondary | ICD-10-CM | POA: Diagnosis present

## 2022-08-11 LAB — COMPREHENSIVE METABOLIC PANEL
ALT: 13 U/L (ref 0–44)
AST: 17 U/L (ref 15–41)
Albumin: 3.7 g/dL (ref 3.5–5.0)
Alkaline Phosphatase: 51 U/L (ref 38–126)
Anion gap: 7 (ref 5–15)
BUN: 11 mg/dL (ref 6–20)
CO2: 27 mmol/L (ref 22–32)
Calcium: 8.9 mg/dL (ref 8.9–10.3)
Chloride: 102 mmol/L (ref 98–111)
Creatinine, Ser: 0.97 mg/dL (ref 0.44–1.00)
GFR, Estimated: >60 mL/min (ref >60)
Glucose, Bld: 102 mg/dL — ABNORMAL HIGH (ref 70–99)
Potassium: 3.9 mmol/L (ref 3.5–5.1)
Sodium: 136 mmol/L (ref 135–145)
Total Bilirubin: 0.7 mg/dL (ref 0.3–1.2)
Total Protein: 8.1 g/dL (ref 6.5–8.1)

## 2022-08-11 LAB — URINALYSIS, ROUTINE W REFLEX MICROSCOPIC
Bilirubin Urine: NEGATIVE
Glucose, UA: NEGATIVE mg/dL
Hgb urine dipstick: NEGATIVE
Ketones, ur: NEGATIVE mg/dL
Leukocytes,Ua: NEGATIVE
Nitrite: NEGATIVE
Protein, ur: NEGATIVE mg/dL
Specific Gravity, Urine: 1.005 (ref 1.005–1.030)
pH: 7 (ref 5.0–8.0)

## 2022-08-11 LAB — CBC
HCT: 38.8 % (ref 36.0–46.0)
Hemoglobin: 12.8 g/dL (ref 12.0–15.0)
MCH: 31.5 pg (ref 26.0–34.0)
MCHC: 33 g/dL (ref 30.0–36.0)
MCV: 95.6 fL (ref 80.0–100.0)
Platelets: 271 10*3/uL (ref 150–400)
RBC: 4.06 MIL/uL (ref 3.87–5.11)
RDW: 14.5 % (ref 11.5–15.5)
WBC: 5.4 10*3/uL (ref 4.0–10.5)
nRBC: 0 % (ref 0.0–0.2)

## 2022-08-11 LAB — LIPASE, BLOOD: Lipase: 36 U/L (ref 11–51)

## 2022-08-11 NOTE — ED Triage Notes (Signed)
Pt to ED c/o abdominal pain x 6 hours, progressively getting worse. Reports central in nature. Denies n/v. Reports last BM yesterday.

## 2022-08-12 ENCOUNTER — Emergency Department (HOSPITAL_COMMUNITY)
Admission: EM | Admit: 2022-08-12 | Discharge: 2022-08-12 | Disposition: A | Discharge status: Home / Self Care | Payer: Medicaid Other | Attending: Emergency Medicine | Admitting: Emergency Medicine

## 2022-08-12 DIAGNOSIS — R1013 Epigastric pain: Secondary | ICD-10-CM

## 2022-08-12 LAB — I-STAT BETA HCG BLOOD, ED (MC, WL, AP ONLY): I-stat hCG, quantitative: <5 m[IU]/mL (ref <5)

## 2022-08-12 MED ORDER — IOHEXOL 300 MG/ML  SOLN
100.0000 mL | Freq: Once | INTRAMUSCULAR | Status: AC | PRN
  Administered 2022-08-12: 100 mL via INTRAVENOUS

## 2022-08-12 MED ORDER — PANTOPRAZOLE SODIUM 40 MG PO TBEC: 40.0000 mg | DELAYED_RELEASE_TABLET | Freq: Every day | ORAL | 3 refills | Status: DC

## 2022-08-12 NOTE — ED Provider Notes (Signed)
[facility] EMERGENCY DEPARTMENT Provider Note   CSN: [id number] Arrival date & time: 08/11/22  2149     History  Chief Complaint  Patient presents with   Abdominal Pain    Mallory Hancock is a 22 y.o. female.  Patient comes to the ER for evaluation of abdominal pain.  Patient started to have pain earlier in the day in the mid upper abdomen.  Over the course of the day the pain worsened and started to shoot downwards towards the lower abdomen at times.  No associated nausea, vomiting or diarrhea.  Patient does have a history of acid reflux for which she takes liquid antacid as needed.  Pain has now subsided.       Home Medications Prior to Admission medications   Medication Sig Start Date End Date Taking? Authorizing Provider  pantoprazole (PROTONIX) 40 MG tablet Take 1 tablet (40 mg total) by mouth daily. 08/12/22  Yes Eutimio Gharibian, Canary Brim, MD  HOMEOPATHIC PRODUCTS PO Take 20 drops by mouth daily as needed (mucus/congestion.). Lymphatic Drainage Drops Supplement    [provider]  HONEY PO Take 5 mLs by mouth daily. Manuka honey    [provider]  ibuprofen (ADVIL) 200 MG tablet Take 200 mg by mouth every 8 (eight) hours as needed (pain.).    [provider]  OVER THE COUNTER MEDICATION Take 1 Scoop by mouth in the morning. Nitric Oxide Supplement  (Mix 1 scoop of powder into 8 ounces of water)    [provider]      Allergies    Penicillins and Tobramycin-dexamethasone    Review of Systems   Review of Systems  Physical Exam Updated Vital Signs BP (!) 121/48   Pulse 63   Temp 98.7 F (37.1 C) (Oral)   Resp 16   Ht 5\' 2"  (1.575 m)   Wt 68 kg   LMP 07/27/2022 (Approximate)   SpO2 100%   BMI 27.44 kg/m  Physical Exam Vitals and nursing note reviewed.  Constitutional:      General: She is not in acute distress.    Appearance: She is well-developed.  HENT:     Head: Normocephalic and atraumatic.     Mouth/Throat:      Mouth: Mucous membranes are moist.  Eyes:     General: Vision grossly intact. Gaze aligned appropriately.     Extraocular Movements: Extraocular movements intact.     Conjunctiva/sclera: Conjunctivae normal.  Cardiovascular:     Rate and Rhythm: Normal rate and regular rhythm.     Pulses: Normal pulses.     Heart sounds: Normal heart sounds, S1 normal and S2 normal. No murmur heard.    No friction rub. No gallop.  Pulmonary:     Effort: Pulmonary effort is normal. No respiratory distress.     Breath sounds: Normal breath sounds.  Abdominal:     General: Bowel sounds are normal.     Palpations: Abdomen is soft.     Tenderness: There is no abdominal tenderness. There is no guarding or rebound.     Hernia: No hernia is present.  Musculoskeletal:        General: No swelling.     Cervical back: Full passive range of motion without pain, normal range of motion and neck supple. No spinous process tenderness or muscular tenderness. Normal range of motion.     Right lower leg: No edema.     Left lower leg: No edema.  Skin:    General: Skin is  warm and dry.     Capillary Refill: Capillary refill takes less than 2 seconds.     Findings: No ecchymosis, erythema, rash or wound.  Neurological:     General: No focal deficit present.     Mental Status: She is alert and oriented to person, place, and time.     GCS: GCS eye subscore is 4. GCS verbal subscore is 5. GCS motor subscore is 6.     Cranial Nerves: Cranial nerves 2-12 are intact.     Sensory: Sensation is intact.     Motor: Motor function is intact.     Coordination: Coordination is intact.  Psychiatric:        Attention and Perception: Attention normal.        Mood and Affect: Mood normal.        Speech: Speech normal.        Behavior: Behavior normal.     ED Results / Procedures / Treatments   Labs (all labs ordered are listed, but only abnormal results are displayed) Labs Reviewed  COMPREHENSIVE METABOLIC PANEL - Abnormal;  Notable for the following components:      Result Value   Glucose, Bld 102 (*)    All other components within normal limits  URINALYSIS, ROUTINE W REFLEX MICROSCOPIC - Abnormal; Notable for the following components:   Color, Urine STRAW (*)    All other components within normal limits  LIPASE, BLOOD  CBC  I-STAT BETA HCG BLOOD, ED ([facility], [facility], [facility] ONLY)    EKG None  Radiology CT ABDOMEN PELVIS W CONTRAST  Result Date: 08/12/2022 CLINICAL DATA:  Acute abdominal pain for several hours EXAM: CT ABDOMEN AND PELVIS WITH CONTRAST TECHNIQUE: Multidetector CT imaging of the abdomen and pelvis was performed using the standard protocol following bolus administration of intravenous contrast. RADIATION DOSE REDUCTION: This exam was performed according to the departmental dose-optimization program which includes automated exposure control, adjustment of the mA and/or kV according to patient size and/or use of iterative reconstruction technique. CONTRAST:  184mL OMNIPAQUE IOHEXOL 300 MG/ML  SOLN COMPARISON:  None Available. FINDINGS: Lower chest: No acute abnormality. Hepatobiliary: No focal liver abnormality is seen. No gallstones, gallbladder wall thickening, or biliary dilatation. Pancreas: Unremarkable. No pancreatic ductal dilatation or surrounding inflammatory changes. Spleen: Normal in size without focal abnormality. Adrenals/Urinary Tract: Adrenal glands are within normal limits. Kidneys are well visualized bilaterally. No renal calculi or obstructive changes are seen. The bladder is partially distended. Stomach/Bowel: The appendix is within normal limits. No obstructive or inflammatory changes of colon are noted. Stomach and small bowel are within normal limits. Vascular/Lymphatic: No significant vascular findings are present. No enlarged abdominal or pelvic lymph nodes. Reproductive: Uterus and bilateral adnexa are unremarkable. Other: No abdominal wall hernia or abnormality. No abdominopelvic ascites.  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 IMPRESSION: No acute abnormality is noted. Electronically Signed   By: Inez Catalina M.D.   On: 08/12/2022 01:57    Procedures Procedures    Medications Ordered in ED Medications  iohexol (OMNIPAQUE) 300 MG/ML solution 100 mL (100 mLs Intravenous Contrast Given 08/12/22 0137)    ED Course/ Medical Decision Making/ A&P                           Medical Decision Making Amount and/or Complexity of Data Reviewed Labs: ordered. Radiology: ordered.  Risk Prescription drug management.   Resents with  abdominal pain.  Pain was predominantly in the epigastric area but did radiate downwards at times.  By the time I was able to examine her, pain had resolved.  Her workup has been very reassuring including lab work, urinalysis, CT scan.  She does have a history of recurrent episodes of GERD.  She does not take a daily PPI because she has difficulty with pills.  She is willing to try Protonix as it is a very small pill.        Final Clinical Impression(s) / ED Diagnoses Final diagnoses:  Epigastric pain    Rx / DC Orders ED Discharge Orders          Ordered    pantoprazole (PROTONIX) 40 MG tablet  Daily        08/12/22 0211              Orpah Greek, MD 08/12/22 [phone]

## 2023-11-13 ENCOUNTER — Ambulatory Visit: Payer: MEDICAID | Admitting: Family Medicine

## 2024-02-05 ENCOUNTER — Ambulatory Visit: Payer: MEDICAID | Admitting: Family Medicine

## 2024-02-06 ENCOUNTER — Ambulatory Visit (INDEPENDENT_AMBULATORY_CARE_PROVIDER_SITE_OTHER): Payer: MEDICAID | Admitting: Family Medicine

## 2024-02-06 ENCOUNTER — Encounter: Payer: Self-pay | Admitting: Family Medicine

## 2024-02-06 VITALS — BP 118/66 | HR 62 | Temp 98.1°F | Ht 59.45 in | Wt 149.6 lb

## 2024-02-06 DIAGNOSIS — L732 Hidradenitis suppurativa: Secondary | ICD-10-CM | POA: Diagnosis not present

## 2024-02-06 DIAGNOSIS — L409 Psoriasis, unspecified: Secondary | ICD-10-CM | POA: Diagnosis not present

## 2024-02-06 MED ORDER — ZORYVE 0.3 % EX FOAM: 1.0000 | Freq: Every day | CUTANEOUS | 5 refills | Status: AC

## 2024-02-06 NOTE — Progress Notes (Signed)
 New Patient Office Visit  Subjective   Patient ID: Mallory Hancock, female    DOB: 01-31-2001  Age: 23 y.o. MRN: [id number]  CC: No chief complaint on file.   HPI Mallory Hancock presents to establish care Pt was previously seen Dr. Nemiah in [place], also was seen previously with [facility] in the past. Dr. Thaddeus is her ENT specialist who she sees regularly. Pt is here with her mom today. States that she does not have any new symptoms or medical issues to report.   Mom reports that she gets nodules in her groin and armpits, sometimes they will come to a head and will open them  and they will drain. Was previously diagnosed with HS in the past. Mom reports that she has seen a dermatologist in the past. Has 1 nodule under her arm that has stayed and will flare up on occasion. Mom reports she uses topical antibacterials creams and washes. Pt also reports she has psoriasis in her scalp, usually gets worsening in the fall.  Pt reports no other chronic medical conditions, has had ENT surgeries, also ORIF of an ankle fracture. No congenital heart abnormalities associated with her Down's syndrome.   Current Outpatient Medications  Medication Instructions   Fluticasone Propionate (FLONASE NA) Place into the nose.   HOMEOPATHIC PRODUCTS PO 20 drops, Daily PRN   HONEY PO 5 mLs, Daily   OVER THE COUNTER MEDICATION 1 Scoop, Every morning   Roflumilast  (ZORYVE ) 0.3 % FOAM 1 Application, Apply externally, Daily    Past Medical History:  Diagnosis Date   Acne    Differin from dermatologist   Allergy    Claritin PRN - patient has not had to take claritin in years per mother 02/09/22   GERD (gastroesophageal reflux disease)    diet controlled   Hydradenitis    Sensorineural hearing loss (SNHL)    bilateral   Trisomy 21    Down syndrome    Past Surgical History:  Procedure Laterality Date   ADENOIDECTOMY     ORIF ANKLE FRACTURE Right 02/10/2022   Procedure: OPEN  REDUCTION INTERNAL FIXATION (ORIF) ANKLE FRACTURE;  Surgeon: Kit Rush, MD;  Location: [facility] OR;  Service: Orthopedics;  Laterality: Right;  regional block 90okay per OR   TONSILLECTOMY     TYMPANOSTOMY TUBE PLACEMENT      Family History  Problem Relation Age of Onset   ALS Maternal Uncle    Kidney disease Father        kidney stones   Cancer Father        testicular cancer, full recovery   Arthritis Maternal Grandmother    Hearing loss Maternal Grandmother    Cancer Maternal Grandfather        colon cancer   Hypertension Maternal Grandfather    Alcohol abuse Neg Hx    Asthma Neg Hx    Birth defects Neg Hx    COPD Neg Hx    Depression Neg Hx    Diabetes Neg Hx    Drug abuse Neg Hx    Early death Neg Hx    Heart disease Neg Hx    Hyperlipidemia Neg Hx    Learning disabilities Neg Hx    Mental illness Neg Hx    Mental retardation Neg Hx    Miscarriages / Stillbirths Neg Hx    Stroke Neg Hx    Vision loss Neg Hx    Varicose Veins Neg Hx     Social History   Socioeconomic  History   Marital status: Single    Spouse name: Not on file   Number of children: Not on file   Years of education: Not on file   Highest education level: Not on file  Occupational History   Not on file  Tobacco Use   Smoking status: Never   Smokeless tobacco: Never  Vaping Use   Vaping status: Never Used  Substance and Sexual Activity   Alcohol use: No   Drug use: No   Sexual activity: Never    Birth control/protection: Abstinence  Other Topics Concern   Not on file  Social History Narrative   Lives with mom and dad, one older half siblings   Home schooled   Supportive home environment, independent self care   Social opportunities thru family, school groups, community   MGF passed away in 11-10-13- family aware of [facility] program thru Hospice if needed.   Social Drivers of Corporate investment banker Strain: Low Risk  (03/21/2023)   Received from [facility]   Overall Financial  Resource Strain (CARDIA)    Difficulty of Paying Living Expenses: Not hard at all  Food Insecurity: Low Risk  (12/28/2023)   Received from [facility]   Hunger Vital Sign    Within the past 12 months, you worried that your food would run out before you got money to buy more: Never true    Within the past 12 months, the food you bought just didn't last and you didn't have money to get more. : Never true  Transportation Needs: No Transportation Needs (12/28/2023)   Received from [facility]    In the past 12 months, has lack of reliable transportation kept you from medical appointments, meetings, work or from getting things needed for daily living? : No  Physical Activity: Not on file  Stress: Not on file  Social Connections: Unknown (02/21/2023)   Received from [facility]   Social Network    Social Network: Not on file  Intimate Partner Violence: Unknown (02/21/2023)   Received from [facility]   HITS    Physically Hurt: Not on file    Insult or Talk Down To: Not on file    Threaten Physical Harm: Not on file    Scream or Curse: Not on file    Review of Systems  All other systems reviewed and are negative.       Objective   BP 118/66 (BP Location: Left Arm, Patient Position: Sitting, Cuff Size: Normal)   Pulse 62   Temp 98.1 F (36.7 C) (Oral)   Ht 4' 11.45 (1.51 m)   Wt 149 lb 9.6 oz (67.9 kg)   LMP 01/21/2024 (Approximate)   SpO2 99%   BMI 29.76 kg/m   Physical Exam Vitals reviewed.  Constitutional:      Appearance: Normal appearance. She is well-groomed and normal weight.  Eyes:     Conjunctiva/sclera: Conjunctivae normal.  Neck:     Thyroid : No thyromegaly.  Cardiovascular:     Rate and Rhythm: Normal rate and regular rhythm.     Pulses: Normal pulses.     Heart sounds: S1 normal and S2 normal.  Pulmonary:     Effort: Pulmonary effort is normal.     Breath sounds: Normal breath sounds and air entry.  Abdominal:     General: Bowel  sounds are normal.  Musculoskeletal:     Right lower leg: No edema.     Left lower leg:  No edema.  Skin:    Comments: Extensive flaking skin and raised patches of redness in the scalp diffusely,   Neurological:     Mental Status: She is alert and oriented to person, place, and time. Mental status is at baseline.     Gait: Gait is intact.  Psychiatric:        Mood and Affect: Mood and affect normal.        Speech: Speech normal.        Behavior: Behavior normal.        Judgment: Judgment normal.         Assessment & Plan:  Psoriasis Assessment & Plan: Psoriasis vs severe seborrheic dermatitis of the scalp-- I recommended Zoryve  application daily. Rx sent to pharmacy. Also recommended selenium sulfide shampoo daily.   Orders: -     Zoryve ; Apply 1 Application topically daily.  Dispense: 60 g; Refill: 5  Hydradenitis Assessment & Plan: Counseled patient on regimen to help reduce flare ups and infections of her HS. Written and verbal instructions given to patient.      Return for annual physical exam-- anytime before the end of the year.   Heron CHRISTELLA Sharper, MD

## 2024-02-06 NOTE — Patient Instructions (Signed)
 Tetanus booster is due  Consider getting Gardasil

## 2024-02-12 NOTE — Assessment & Plan Note (Signed)
 Psoriasis vs severe seborrheic dermatitis of the scalp-- I recommended Zoryve  application daily. Rx sent to pharmacy. Also recommended selenium sulfide shampoo daily.

## 2024-02-12 NOTE — Assessment & Plan Note (Signed)
 Counseled patient on regimen to help reduce flare ups and infections of her HS. Written and verbal instructions given to patient.

## 2024-04-06 ENCOUNTER — Other Ambulatory Visit: Payer: Self-pay | Admitting: Family Medicine

## 2024-04-15 ENCOUNTER — Other Ambulatory Visit: Payer: Self-pay | Admitting: Family Medicine

## 2024-04-16 NOTE — Telephone Encounter (Unsigned)
 Copied from CRM [id number]. Topic: Clinical - Prescription Issue >> Apr 16, 2024  2:55 PM Rea C wrote: Reason for CRM: Patient's mom had called in and stated that they met with Dr. Ozell and she thought she would call into the pharmacy a script for Clindamycin  wipes. Patient's mom said that pharmacy stated to have sent in a request for the wipes. Patients mom contact: [phone]  [facility] [place], [place] - [address] [place] [address] Phone: [phone] Fax: [phone] Hours: Not open 24 hours

## 2024-04-19 ENCOUNTER — Telehealth: Payer: Self-pay

## 2024-04-19 ENCOUNTER — Other Ambulatory Visit (HOSPITAL_COMMUNITY): Payer: Self-pay

## 2024-04-19 NOTE — Telephone Encounter (Signed)
 Pharmacy Patient Advocate Encounter   Received notification from Onbase that prior authorization for Cleocin  T swabs is required/requested.   Insurance verification completed.   The patient is insured through [employer] [place] [employer] .   Per test claim: The current 30 day co-pay is, $4.00.  No PA needed at this time. This test claim was processed through [facility]- copay amounts may vary at other pharmacies due to pharmacy/plan contracts, or as the patient moves through the different stages of their insurance plan.

## 2024-04-26 ENCOUNTER — Telehealth: Payer: Self-pay | Admitting: Family Medicine

## 2024-04-26 NOTE — Telephone Encounter (Signed)
 [facility] called and spoke to Diane, Pensions consultant about the refill(s) Clindamycin  requested. She says it's requiring a PA. Advised per TE on 04/19/24 that PA is not needed, read the information in that encounter. Josh, RPH tried to run this medication through and says it's showing a PA is needed through [facility], insurance verified in chart with the correct ID number. Advised I will send this to the office.      Copied from CRM #[id number]. Topic: Clinical - Prescription Issue >> Apr 26, 2024  3:21 PM Rea ORN wrote: Reason for CRM: Pt mom Bascom called to get assistance with Clindamycin -Benzoyl Per (Refr) 1.2-5 % rx. Its in review with the pharmacy and Bascom wants to know what more PCP Dr. Ozell can do get them reviewed and processed.  Call Back # [phone]

## 2024-04-29 ENCOUNTER — Other Ambulatory Visit (HOSPITAL_COMMUNITY): Payer: Self-pay

## 2024-04-29 NOTE — Telephone Encounter (Signed)
 Per Mallory Hancock's encounter on 04/19/2024 prior auth was approved. I ran a test claim and received a paid claim as well for $4.

## 2024-04-29 NOTE — Telephone Encounter (Signed)
 Spoke with Rosaline at [facility] and informed her of the approval as below.

## 2024-07-15 ENCOUNTER — Encounter: Payer: MEDICAID | Admitting: Family Medicine

## 2024-08-15 ENCOUNTER — Encounter: Payer: MEDICAID | Admitting: Family Medicine
